# Patient Record
Sex: Male | Born: 1985 | Race: Black or African American | Hispanic: No | Marital: Single | Smoking: Current every day smoker
Health system: Southern US, Community
[De-identification: ages and names within clinical notes are randomized; demographics above are authoritative.]

## PROBLEM LIST (undated history)

## (undated) ENCOUNTER — Ambulatory Visit (HOSPITAL_COMMUNITY): Admission: EM | Payer: Self-pay

## (undated) DIAGNOSIS — K219 Gastro-esophageal reflux disease without esophagitis: Secondary | ICD-10-CM

## (undated) DIAGNOSIS — IMO0001 Reserved for inherently not codable concepts without codable children: Secondary | ICD-10-CM

---
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
## 2007-06-28 ENCOUNTER — Emergency Department (HOSPITAL_COMMUNITY): Admission: EM | Admit: 2007-06-28 | Discharge: 2007-06-28 | Payer: Self-pay | Admitting: Emergency Medicine

## 2011-08-24 ENCOUNTER — Emergency Department (HOSPITAL_COMMUNITY)
Admission: EM | Admit: 2011-08-24 | Discharge: 2011-08-24 | Disposition: A | Discharge status: Home / Self Care | Payer: Self-pay | Attending: Emergency Medicine | Admitting: Emergency Medicine

## 2011-08-24 ENCOUNTER — Encounter (HOSPITAL_COMMUNITY): Payer: Self-pay | Admitting: *Deleted

## 2011-08-24 DIAGNOSIS — S61409A Unspecified open wound of unspecified hand, initial encounter: Secondary | ICD-10-CM | POA: Insufficient documentation

## 2011-08-24 DIAGNOSIS — S61419A Laceration without foreign body of unspecified hand, initial encounter: Secondary | ICD-10-CM

## 2011-08-24 DIAGNOSIS — IMO0002 Reserved for concepts with insufficient information to code with codable children: Secondary | ICD-10-CM | POA: Insufficient documentation

## 2011-08-24 MED ORDER — LIDOCAINE-EPINEPHRINE-TETRACAINE (LET) SOLUTION
3.0000 mL | Freq: Once | NASAL | Status: AC
  Administered 2011-08-24: 3 mL via TOPICAL
  Filled 2011-08-24: qty 3

## 2011-08-24 MED ORDER — TETANUS-DIPHTH-ACELL PERTUSSIS 5-2.5-18.5 LF-MCG/0.5 IM SUSP
0.5000 mL | Freq: Once | INTRAMUSCULAR | Status: AC
  Administered 2011-08-24: 0.5 mL via INTRAMUSCULAR
  Filled 2011-08-24: qty 0.5

## 2011-08-24 MED ORDER — AMOXICILLIN-POT CLAVULANATE 875-125 MG PO TABS: 1.0000 | ORAL_TABLET | Freq: Two times a day (BID) | ORAL | Status: AC
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
## 2011-08-24 NOTE — ED Notes (Signed)
Wound care complete. Soaked and scrubbed with betadine and rinsed with saline. Waiting for Doctor to recheck.

## 2011-08-24 NOTE — Progress Notes (Signed)
Orthopedic Tech Progress Note Patient Details:  Alan Richardson 1985/09/28 [id number]  Type of Splint: Volar Splint Interventions: Application    Cammer, Mickie Bail 08/24/2011, 2:38 PM

## 2011-08-24 NOTE — Discharge Instructions (Signed)
Delayed Wound Closure Sometimes, your caregiver may decide to delay closing a wound for several days. This is done when the wound is badly bruised, dirty, or when it has been too long since the injury. By delaying the closure of your cut, the risk of infection is reduced. Wounds that are closed in 3 to 7 days after being cleaned up and dressed heal just as well as those that are closed right away. Rest and elevate the injured area until all the pain and swelling are gone.  SEEK MEDICAL CARE IF:  You develop unusual swelling or redness around the wound.   You have increasing pain or tenderness.   There is a bad smelling fluid (drainage) coming from the wound.  Have your wound checked as instructed by your caregiver. Document Released: 04/04/2005 Document Revised: 03/24/2011 Document Reviewed: 09/13/2006 [facility] Patient Information 2012 [place], [place].

## 2011-11-07 ENCOUNTER — Emergency Department (HOSPITAL_COMMUNITY)
Admission: EM | Admit: 2011-11-07 | Discharge: 2011-11-08 | Disposition: A | Discharge status: Home / Self Care | Payer: Self-pay | Attending: Emergency Medicine | Admitting: Emergency Medicine

## 2011-11-07 ENCOUNTER — Encounter (HOSPITAL_COMMUNITY): Payer: Self-pay | Admitting: Emergency Medicine

## 2011-11-07 DIAGNOSIS — B356 Tinea cruris: Secondary | ICD-10-CM

## 2011-11-07 DIAGNOSIS — K219 Gastro-esophageal reflux disease without esophagitis: Secondary | ICD-10-CM | POA: Insufficient documentation

## 2011-11-07 DIAGNOSIS — R21 Rash and other nonspecific skin eruption: Secondary | ICD-10-CM | POA: Insufficient documentation

## 2011-11-07 DIAGNOSIS — F17200 Nicotine dependence, unspecified, uncomplicated: Secondary | ICD-10-CM | POA: Insufficient documentation

## 2011-11-07 HISTORY — DX: Reserved for inherently not codable concepts without codable children: IMO0001

## 2011-11-07 HISTORY — DX: Gastro-esophageal reflux disease without esophagitis: K21.9

## 2011-11-07 NOTE — ED Notes (Signed)
Advised of the wait time 

## 2011-11-07 NOTE — ED Notes (Addendum)
When sleeps at night, has been having heartburn, wakes up with burping and feeling like throwing up; used to take zantac but has run out, last took it a couple weeks ago, and that's when noticed the heartburn getting worse, feels regurgitation of food, but doesn't actually throw up; also reports that went to beach last week and has a rash in between his legs with itching;

## 2011-11-08 MED ORDER — OMEPRAZOLE 20 MG PO CPDR: 20.0000 mg | DELAYED_RELEASE_CAPSULE | Freq: Every day | ORAL | Status: DC

## 2011-11-08 MED ORDER — MICONAZOLE NITRATE 2 % EX CREA: TOPICAL_CREAM | Freq: Two times a day (BID) | CUTANEOUS | Status: AC

## 2011-11-08 NOTE — ED Provider Notes (Signed)
History     CSN: [id number]  Arrival date & time 11/07/11  2157   First MD Initiated Contact with Patient 11/07/11 2338      Chief Complaint  Patient presents with  . Gastrophageal Reflux  . Rash    (Consider location/radiation/quality/duration/timing/severity/associated sxs/prior treatment) HPI Comments: Patient presents emergency department with multiple chief complaints.  Primarily he states that he's noticed a rash in his inguinal creases that began approximately one week ago.  He describes it is extremely pruritic in nature and denies any fevers, night sweats, chills.  In addition patient reports that he's been having worsened acid reflux and that he usually takes Zyrtec however he has not taken any in one week.  He denies any abdominal pain, melena, hematochezia, hematemesis, nausea, vomiting, diarrhea, or gross blood in stool.  No other complaints at this time.  Patient is a 26 y.o. male presenting with GERD and rash. The history is provided by the patient.  Gastrophageal Reflux Associated symptoms include a rash.  Rash     Past Medical History  Diagnosis Date  . Reflux     History reviewed. No pertinent past surgical history.  History reviewed. No pertinent family history.  History  Substance Use Topics  . Smoking status: Current Everyday Smoker -- 0.2 packs/day  . Smokeless tobacco: Not on file  . Alcohol Use: Yes     on weekends      Review of Systems  Skin: Positive for rash.  All other systems reviewed and are negative.    Allergies  Review of patient's allergies indicates no known allergies.  Home Medications  No current outpatient prescriptions on file.  BP 152/82  Pulse 112  Temp 98.3 F (36.8 C) (Oral)  Resp 16  SpO2 98%  Physical Exam  Nursing note and vitals reviewed. Constitutional: He is oriented to person, place, and time. He appears well-developed and well-nourished. No distress.  HENT:  Head: Normocephalic and atraumatic.    Moist mucous membranes, uvula midline  Eyes: Conjunctivae and EOM are normal.  Neck: Normal range of motion.  Pulmonary/Chest: Effort normal.  Abdominal:       Soft nontender abdomen with bowel sounds present  Musculoskeletal: Normal range of motion.  Neurological: He is alert and oriented to person, place, and time.  Skin: Skin is warm and dry. No rash noted. He is not diaphoretic.       Rash located in inguinal full bilaterally, hyperpigmentation, and scaly. Skin intact, no lesions.   Psychiatric: He has a normal mood and affect. His behavior is normal.    ED Course  Procedures (including critical care time)  Labs Reviewed - No data to display No results found.   No diagnosis found.    MDM  Groin rash: Presentation C/w Tinia Cruris - prescribed topical antifungal, home care instructions and return precautions discussed GERD: Prilosec started. Follow up w PCP         Jaci Carrel, PA-C 11/08/11 0019

## 2011-11-17 NOTE — ED Provider Notes (Signed)
Medical screening examination/treatment/procedure(s) were performed by non-physician practitioner and as supervising physician I was immediately available for consultation/collaboration.   Naudia Crosley, MD 11/17/11 2313 

## 2013-09-15 ENCOUNTER — Emergency Department (HOSPITAL_COMMUNITY)
Admission: EM | Admit: 2013-09-15 | Discharge: 2013-09-16 | Disposition: A | Discharge status: Home / Self Care | Payer: Self-pay | Attending: Emergency Medicine | Admitting: Emergency Medicine

## 2013-09-15 ENCOUNTER — Encounter (HOSPITAL_COMMUNITY): Payer: Self-pay | Admitting: Emergency Medicine

## 2013-09-15 DIAGNOSIS — Z8719 Personal history of other diseases of the digestive system: Secondary | ICD-10-CM | POA: Insufficient documentation

## 2013-09-15 DIAGNOSIS — Z791 Long term (current) use of non-steroidal anti-inflammatories (NSAID): Secondary | ICD-10-CM | POA: Insufficient documentation

## 2013-09-15 DIAGNOSIS — K089 Disorder of teeth and supporting structures, unspecified: Secondary | ICD-10-CM | POA: Insufficient documentation

## 2013-09-15 DIAGNOSIS — R42 Dizziness and giddiness: Secondary | ICD-10-CM | POA: Insufficient documentation

## 2013-09-15 DIAGNOSIS — Z792 Long term (current) use of antibiotics: Secondary | ICD-10-CM | POA: Insufficient documentation

## 2013-09-15 DIAGNOSIS — R112 Nausea with vomiting, unspecified: Secondary | ICD-10-CM | POA: Insufficient documentation

## 2013-09-15 DIAGNOSIS — K0889 Other specified disorders of teeth and supporting structures: Secondary | ICD-10-CM

## 2013-09-15 DIAGNOSIS — F17200 Nicotine dependence, unspecified, uncomplicated: Secondary | ICD-10-CM | POA: Insufficient documentation

## 2013-09-15 DIAGNOSIS — K029 Dental caries, unspecified: Secondary | ICD-10-CM | POA: Insufficient documentation

## 2013-09-15 LAB — CBG MONITORING, ED: Glucose-Capillary: 224 mg/dL — ABNORMAL HIGH (ref 70–99)

## 2013-09-15 NOTE — ED Notes (Signed)
Pt states he woke up today feeling dizzy, he has vomited a few times and complains of a slight headache

## 2013-09-15 NOTE — ED Notes (Signed)
Patients tooth pain not related to tonight's visit.

## 2013-09-16 LAB — CBC
HEMATOCRIT: 43.1 % (ref 39.0–52.0)
Hemoglobin: 15 g/dL (ref 13.0–17.0)
MCH: 29.4 pg (ref 26.0–34.0)
MCHC: 34.8 g/dL (ref 30.0–36.0)
MCV: 84.5 fL (ref 78.0–100.0)
PLATELETS: 240 10*3/uL (ref 150–400)
RBC: 5.1 MIL/uL (ref 4.22–5.81)
RDW: 12.9 % (ref 11.5–15.5)
WBC: 8.2 10*3/uL (ref 4.0–10.5)

## 2013-09-16 LAB — I-STAT TROPONIN, ED: Troponin i, poc: 0 ng/mL (ref 0.00–0.08)

## 2013-09-16 LAB — BASIC METABOLIC PANEL
BUN: 10 mg/dL (ref 6–23)
CO2: 24 mEq/L (ref 19–32)
CREATININE: 0.78 mg/dL (ref 0.50–1.35)
Calcium: 9.5 mg/dL (ref 8.4–10.5)
Chloride: 101 mEq/L (ref 96–112)
Glucose, Bld: 197 mg/dL — ABNORMAL HIGH (ref 70–99)
POTASSIUM: 4.2 meq/L (ref 3.7–5.3)
Sodium: 137 mEq/L (ref 137–147)

## 2013-09-16 MED ORDER — DICLOFENAC SODIUM 50 MG PO TBEC: 50.0000 mg | DELAYED_RELEASE_TABLET | Freq: Two times a day (BID) | ORAL | Status: DC

## 2013-09-16 MED ORDER — AMOXICILLIN 500 MG PO CAPS: 500.0000 mg | ORAL_CAPSULE | Freq: Three times a day (TID) | ORAL | Status: DC

## 2013-09-16 MED ORDER — ONDANSETRON HCL 4 MG PO TABS: 4.0000 mg | ORAL_TABLET | Freq: Four times a day (QID) | ORAL | Status: DC

## 2013-09-16 MED ORDER — ONDANSETRON HCL 4 MG/2ML IJ SOLN
4.0000 mg | Freq: Once | INTRAMUSCULAR | Status: AC
  Administered 2013-09-16: 4 mg via INTRAVENOUS
  Filled 2013-09-16: qty 2

## 2013-09-16 MED ORDER — MECLIZINE HCL 12.5 MG PO TABS: 12.5000 mg | ORAL_TABLET | Freq: Three times a day (TID) | ORAL | Status: DC | PRN

## 2013-09-16 MED ORDER — MECLIZINE HCL 25 MG PO TABS
25.0000 mg | ORAL_TABLET | Freq: Once | ORAL | Status: AC
  Administered 2013-09-16: 25 mg via ORAL
  Filled 2013-09-16: qty 1

## 2013-09-16 MED ORDER — SODIUM CHLORIDE 0.9 % IV BOLUS (SEPSIS)
500.0000 mL | Freq: Once | INTRAVENOUS | Status: AC
  Administered 2013-09-16: 500 mL via INTRAVENOUS

## 2013-09-16 NOTE — Discharge Instructions (Signed)
Take amoxicillin to cover for infection. Take diclofenac as prescribed for pain control as needed. Take meclizine for dizziness and Zofran for nausea as needed. Followup with your primary care provider in dentist.  Benign Positional Vertigo Vertigo means you feel like you or your surroundings are moving when they are not. Benign positional vertigo is the most common form of vertigo. Benign means that the cause of your condition is not serious. Benign positional vertigo is more common in older adults. CAUSES  Benign positional vertigo is the result of an upset in the labyrinth system. This is an area in the middle ear that helps control your balance. This may be caused by a viral infection, head injury, or repetitive motion. However, often no specific cause is found. SYMPTOMS  Symptoms of benign positional vertigo occur when you move your head or eyes in different directions. Some of the symptoms may include:  Loss of balance and falls.  Vomiting.  Blurred vision.  Dizziness.  Nausea.  Involuntary eye movements (nystagmus). DIAGNOSIS  Benign positional vertigo is usually diagnosed by physical exam. If the specific cause of your benign positional vertigo is unknown, your caregiver may perform imaging tests, such as magnetic resonance imaging (MRI) or computed tomography (CT). TREATMENT  Your caregiver may recommend movements or procedures to correct the benign positional vertigo. Medicines such as meclizine, benzodiazepines, and medicines for nausea may be used to treat your symptoms. In rare cases, if your symptoms are caused by certain conditions that affect the inner ear, you may need surgery. HOME CARE INSTRUCTIONS   Follow your caregiver's instructions.  Move slowly. Do not make sudden body or head movements.  Avoid driving.  Avoid operating heavy machinery.  Avoid performing any tasks that would be dangerous to you or others during a vertigo episode.  Drink enough fluids to  keep your urine clear or pale yellow. SEEK IMMEDIATE MEDICAL CARE IF:   You develop problems with walking, weakness, numbness, or using your arms, hands, or legs.  You have difficulty speaking.  You develop severe headaches.  Your nausea or vomiting continues or gets worse.  You develop visual changes.  Your family or friends notice any behavioral changes.  Your condition gets worse.  You have a fever.  You develop a stiff neck or sensitivity to light. MAKE SURE YOU:   Understand these instructions.  Will watch your condition.  Will get help right away if you are not doing well or get worse. Document Released: 01/10/2006 Document Revised: 06/27/2011 Document Reviewed: 12/23/2010 [facility] Patient Information 2014 [place], [place].  Dental Pain Toothache is pain in or around a tooth. It may get worse with chewing or with cold or heat.  HOME CARE  Your dentist may use a numbing medicine during treatment. If so, you may need to avoid eating until the medicine wears off. Ask your dentist about this.  Only take medicine as told by your dentist or doctor.  Avoid chewing food near the painful tooth until after all treatment is done. Ask your dentist about this. GET HELP RIGHT AWAY IF:   The problem gets worse or new problems appear.  You have a fever.  There is redness and puffiness (swelling) of the face, jaw, or neck.  You cannot open your mouth.  There is pain in the jaw.  There is very bad pain that is not helped by medicine. MAKE SURE YOU:   Understand these instructions.  Will watch your condition.  Will get help right away if you  are not doing well or get worse. Document Released: 09/21/2007 Document Revised: 06/27/2011 Document Reviewed: 09/21/2007 [facility] Patient Information 2014 [place].  Emergency Department Resource Guide 1) Find a Doctor and Pay Out of Pocket Although you won't have to find out who is covered by your insurance plan, it is a  good idea to ask around and get recommendations. You will then need to call the office and see if the doctor you have chosen will accept you as a new patient and what types of options they offer for patients who are self-pay. Some doctors offer discounts or will set up payment plans for their patients who do not have insurance, but you will need to ask so you aren't surprised when you get to your appointment.  2) Contact Your Local Health Department Not all health departments have doctors that can see patients for sick visits, but many do, so it is worth a call to see if yours does. If you don't know where your local health department is, you can check in your phone book. The CDC also has a tool to help you locate your state's health department, and many state websites also have listings of all of their local health departments.  3) Find a Walk-in Clinic If your illness is not likely to be very severe or complicated, you may want to try a walk in clinic. These are popping up all over the country in pharmacies, drugstores, and shopping centers. They're usually staffed by nurse practitioners or physician assistants that have been trained to treat common illnesses and complaints. They're usually fairly quick and inexpensive. However, if you have serious medical issues or chronic medical problems, these are probably not your best option.  No Primary Care Doctor: - Call Health Connect at  [phone] - they can help you locate a primary care doctor that  accepts your insurance, provides certain services, etc. - Physician Referral Service- [phone]  Chronic Pain Problems: Organization         Address  Phone   Notes  [facility]  [phone] Patients need to be referred by their primary care doctor.   Medication Assistance: Organization         Address  Phone   Notes  [facility] [address] [place], [place] [address] [phone])  [phone] --Must be a resident of [place] [facility] -- Must have NO insurance coverage whatsoever (no Medicaid/ Medicare, etc.) -- The pt. MUST have a primary care doctor that directs their care regularly and follows them in the community   Med[facility]ist  [phone]   [employer]  [phone]    Agencies that provide inexpensive medical care: Organization         Address  Phone   Notes  [facility]  [phone]) [phone]   [facility]    [phone]) [phone]   [facility] [address] [place], [place] [address] [phone]) [phone]   [facility] [address], [place] [phone]   [facility]anned Parenthood    [phone](336) [phone]   [facility]    [phone]   [facility]  [address], [place] Phone:  [phone]) [phone], Fax:  [phone] Hours of Operation:  9 am - 6 pm, M-F.  Also accepts Medicaid/Medicare and self-pay.  [facility]  [address],  Phone: [phone],  Fax: [phone]. Hours of Operation:  8:30 am - 5:30 pm, M-F.  Also accepts Medicaid and self-pay.  [facility] [place] [address] [place] Phone: [phone]   [facility] [address] Natasha Bence [place], [place] [phone], Ext. [phone] Mondays & Thursdays: 7-9 AM.  First 15 patients are seen on a first come, first serve basis.    Medicaid-accepting [facility] Providers:  Organization         Address  Phone   Notes  [facility] [address], [place] [phone] Also accepts self-pay patients.  [facility] [address] Laurell Josephs [place], [place]  [phone]   [facility] [address], [place] [phone]   [facility] [address] [place] [phone]   Renaye Rakers [address], [place]   [phone] Only accepts [place] Access [place] patients after they have their name applied to their card.   Self-Pay (no insurance) in [facility]:  Organization         Address  Phone   Notes  Sickle Cell Patients, [facility] [address] [place], [place] [phone]   [facility] [address] [place], [place] [phone]   [facility] [place]  [address], [place] [phone]   [facility]/Dr. Osei-Bonsu  [address] [place] or [address], [place] [phone] Phone number for both [place] and [place] locations is the same.  [facility] [address], [place] [phone]   [facility] [address] [place] or [address] [phone]   [facility] [address], [place] [phone], phone; [phone], fax Sees patients 1st and 3rd Saturday of every month.  Must not qualify for public or private insurance (i.e. Medicaid, Medicare, [place] Health Choice, Veterans' Benefits)  Household income should be no more than 200% of the poverty level The clinic cannot treat you if you are pregnant or think you are pregnant  Sexually transmitted diseases are not treated at the clinic.    Dental Care: Organization         Address  Phone  Notes  [facility] of [facility] [address] [place], [place] [phone] Accepts children up to age 43 who are enrolled in [place] or [place] Health Choice; pregnant women with a Medicaid card; and children who have applied for Medicaid or [place] Health Choice, but were declined, whose parents can pay a reduced fee at time of service.  [facility]  [address], [place] [phone] Accepts children up to age 77 who are enrolled in [place] or [place] Health  Choice; pregnant women with a Medicaid card; and children who have applied for Medicaid or [place] Health Choice, but were declined, whose parents can pay a reduced fee at time of service.  [facility]  [address] [place], [place] [phone] Patients are seen by appointment only. Walk-ins are not accepted. [facility] will see patients 19 years of age and older. Monday - Tuesday (8am-5pm) Most Wednesdays (8:30-5pm) $30 per visit, cash only  [facility] [employer] [facility]  [address], [employer] (  [phone] Patients are seen by appointment only. Walk-ins are not accepted. [facility] will see patients 67 years of age and older. One Wednesday Evening (Monthly: Volunteer Based).  $30 per visit, cash only  [employer]  [phone] for adults; Children under age 39, call Graduate Pediatric Dentistry at [phone]. Children aged 74-14, please call [phone] to request a pediatric application.  Dental services are provided in all areas of dental care including fillings, crowns and bridges, complete and partial dentures, implants, gum treatment, root canals, and extractions. Preventive care is also provided. Treatment is provided to both adults and children. Patients are selected via a lottery and there is often a waiting list.   [facility] [address], [place]  [phone] [email or web address]   [facility] [address] [place], [place] [phone], Ext. [phone] Second and Fourth Thursday of each month, opens at 6:30 AM; Clinic ends at 9 AM.  Patients are seen on a first-come first-served basis, and a limited number are seen during each clinic.   [facility]  [address] Ether [place], [place] [phone]   Eligibility Requirements You must have lived in [place], [place], or [place] counties for at least the last three months.   You cannot be eligible for state or  federal sponsored [place], including [employer], [place], or [employer].   You generally cannot be eligible for healthcare insurance through your employer.    How to apply: Eligibility screenings are held every Tuesday and Wednesday afternoon from 1:00 pm until 4:00 pm. You do not need an appointment for the interview!  [facility] [address] [place], [place] [phone]   [facility] Health Department  [phone]   [facility] Health Department  [phone]   [facility] Health Department  [phone]    Behavioral Health Resources in the Community: Intensive Outpatient Programs Organization         Address  Phone  Notes  [facility] Services [address] [place], [place] [phone]   [facility] Outpatient [address], [place], [place] [phone]   [facility] [address] [place], [place]  [phone]   [facility] Mental Health [address],  [place], [place] [phone] or [phone]   Substance Abuse Resources Organization         Address  Phone  Notes  [facility] Drug [facility]  8438038124   [facility]  [phone]   [facility]  [phone]   Floydene Flock  [phone]   Residential & Outpatient Substance Abuse Program  [phone]   Psychological Services Organization         Address  Phone  Notes  [facility]  [phone]   [facility] Services  [phone]   [facility] Mental Health [address], [place] [phone] or [phone]    Mobile Crisis Teams Organization         Address  Phone  Notes  [facility], Mobile Crisis Care Unit  [phone]   [facility]  [address] [place], [place] [phone]   Doristine Locks [address] [place] [phone]    Self-Help/Support Groups Organization         Address  Phone              Notes  [facility] [place] - variety of support groups  336- I7437963 Call for more information  Narcotics Anonymous (NA), [facility] [address], [employer] [place]  2 meetings at this location   Residential Sports administrator         Address  Phone  Notes  [facility] [address],    [place]  [phone]   [facility]  [address] [place] [address], [place], [place] [phone]   [facility] [address] [place], [place] [phone] Admissions: 8am-3pm M-F  [facility] [address]    [place], [place] [phone]   [facility] [address] [place], [place], [place] [phone]   [facility] [address],  [place], [place] [phone]   [facility] - Intensive Outpatient [address] Laurell Josephs [address], [place], [place] [phone]   [facility] (Addiction Recovery [facility] [address] [place].,  [place], [place] [phone] or [phone]   [facility] ([facility]) [address] [place], [place] [phone] Accepts Medicaid  [facility] [address],  [place] [phone] Substance Abuse/Addiction Treatment   [facility] Organization         Address  Phone  Notes  [facility]  4248242380   Angie Fava, PhD [address] Ervin Knack [place], [place]   [phone] or [phone]   [facility]   [address] [place], [place] [phone]   [facility] [address], [place], [place] [phone] Insurance/Medicaid/sponsorship through [facility] and Families [address]                                    [employer] [employer], [place] [phone] Therapy/tele-psych/case  [facility] [address], [place] [phone]    Dr. Lolly Mustache  [phone]   [facility] [place]  [employer] [facility]  Dept. 1) [address] [place] 2) [address] [place] 3)  [address], [place] [phone]  [phone]   [facility] Child Abuse Hotline 506-165-6672 or [phone] (After Hours)

## 2013-09-16 NOTE — ED Notes (Signed)
Patient states his mother has diabetes, but he has not been checked by his primary that he is aware of.

## 2013-09-16 NOTE — ED Provider Notes (Signed)
CSN: [id number]     Arrival date & time 09/15/13  2245 History   First MD Initiated Contact with Patient 09/16/13 0030     Chief Complaint  Patient presents with  . Dizziness    (Consider location/radiation/quality/duration/timing/severity/associated sxs/prior Treatment) HPI Comments: 28 year old male presents to the emergency department for dizziness. Patient states that he awoke from sleep feeling as though the room was spinning. He cannot recall the direction in which he felt the room was spinning. Patient states that symptoms have been intermittent throughout the day and are worse with position change, most specifically from supine to sitting or sitting to standing. Symptoms associated with nausea as well as 2 episodes of nonbloody emesis. Patient states the symptoms have been preceded by right upper dentalgia which causes an intermittent headache. Patient denies any headache today since dizziness started. He further denies associated fever, vision changes, vision loss, tinnitus or hearing loss, difficulty speaking or swallowing, neck stiffness, shortness of breath, chest pain, abdominal pain, numbness/tingling, and extremity weakness. He denies any head injury or trauma. He states that he ingested Mollies 2 days ago. No hx of vertigo.  The history is provided by the patient. No language interpreter was used.    Past Medical History  Diagnosis Date  . Reflux    History reviewed. No pertinent past surgical history. History reviewed. No pertinent family history. History  Substance Use Topics  . Smoking status: Current Every Day Smoker -- 0.25 packs/day  . Smokeless tobacco: Not on file  . Alcohol Use: Yes     Comment: on weekends    Review of Systems  Constitutional: Negative for fever.  HENT: Positive for dental problem. Negative for drooling, facial swelling, hearing loss, sore throat, tinnitus and trouble swallowing.   Eyes: Negative for visual disturbance.  Respiratory: Negative  for shortness of breath.   Cardiovascular: Negative for chest pain.  Gastrointestinal: Positive for nausea and vomiting. Negative for abdominal pain.  Neurological: Positive for dizziness. Negative for syncope, weakness, numbness and headaches.  All other systems reviewed and are negative.    Allergies  Review of patient's allergies indicates no known allergies.  Home Medications   Prior to Admission medications   Medication Sig Start Date End Date Taking? Authorizing Provider  acetaminophen (TYLENOL) 500 MG tablet Take 1,500-2,000 mg by mouth every 6 (six) hours as needed for moderate pain.   Yes Historical Provider, MD  Oxycodone HCl 10 MG TABS Take 10 mg by mouth once.   Yes Historical Provider, MD  amoxicillin (AMOXIL) 500 MG capsule Take 1 capsule (500 mg total) by mouth 3 (three) times daily. 09/16/13   Antony Madura, PA-C  diclofenac (VOLTAREN) 50 MG EC tablet Take 1 tablet (50 mg total) by mouth 2 (two) times daily. 09/16/13   Antony Madura, PA-C  meclizine (ANTIVERT) 12.5 MG tablet Take 1 tablet (12.5 mg total) by mouth 3 (three) times daily as needed for dizziness. 09/16/13   Antony Madura, PA-C  ondansetron (ZOFRAN) 4 MG tablet Take 1 tablet (4 mg total) by mouth every 6 (six) hours. 09/16/13   Antony Madura, PA-C   BP 135/82  Pulse 86  Temp(Src) 97.7 F (36.5 C) (Oral)  Resp 20  Ht 5\' 9"  (1.753 m)  Wt 200 lb (90.719 kg)  BMI 29.52 kg/m2  SpO2 95%  Physical Exam  Nursing note and vitals reviewed. Constitutional: He is oriented to person, place, and time. He appears well-developed and well-nourished. No distress.  Nontoxic/nonseptic appearing  HENT:  Head: Normocephalic  and atraumatic.  Right Ear: Hearing, tympanic membrane, external ear and ear canal normal. No mastoid tenderness.  Left Ear: Hearing, tympanic membrane, external ear and ear canal normal. No mastoid tenderness.  Nose: Nose normal.  Mouth/Throat: Uvula is midline, oropharynx is clear and moist and mucous membranes  are normal. No oral lesions. No trismus in the jaw. Abnormal dentition. Dental caries present. No dental abscesses or uvula swelling. No oropharyngeal exudate.    Uvula midline. No fluctuance. Patient tolerating secretions without difficulty.  Eyes: Conjunctivae and EOM are normal. Pupils are equal, round, and reactive to light. No scleral icterus.  No nystagmus. Normal EOMs. Pupils equal round and reactive to direct and consensual light.  Neck: Normal range of motion. Neck supple.  Cardiovascular: Normal rate, regular rhythm and normal heart sounds.   Pulmonary/Chest: Effort normal and breath sounds normal. No respiratory distress. He has no wheezes. He has no rales.  Chest expansion symmetric.  Abdominal: Soft. He exhibits no distension. There is no tenderness.  Soft and nontender  Musculoskeletal: Normal range of motion.  Neurological: He is alert and oriented to person, place, and time. No cranial nerve deficit. He exhibits normal muscle tone. Coordination normal.  GCS 15. No cranial nerve deficits appreciated; symmetric eyebrow areas, no facial drooping. Patient moves extremities without ataxia. Normal and equal grip strength bilaterally. Normal sensation to light touch in all extremities. Patient ambulates with normal gait.  Skin: Skin is warm and dry. No rash noted. He is not diaphoretic. No erythema. No pallor.  Psychiatric: He has a normal mood and affect. His behavior is normal.    ED Course  Procedures (including critical care time) Labs Review Labs Reviewed  BASIC METABOLIC PANEL - Abnormal; Notable for the following:    Glucose, Bld 197 (*)    All other components within normal limits  CBG MONITORING, ED - Abnormal; Notable for the following:    Glucose-Capillary 224 (*)    All other components within normal limits  CBC  I-STAT TROPOININ, ED    Imaging Review No results found.   EKG Interpretation   Date/Time:  Sunday Sep 15 2013 23:50:34 EDT Ventricular Rate:   94 PR Interval:  127 QRS Duration: 87 QT Interval:  356 QTC Calculation: 445 R Axis:   81 Text Interpretation:  Sinus rhythm Borderline T wave abnormalities ST  elevation in the apical and inf  leads < 66mm Confirmed by Rhunette Croft, MD,  Janey Genta ([id number]) on 09/16/2013 12:09:48 AM      MDM   Final diagnoses:  Vertigo  Dentalgia    Patient presents today for dizziness. Symptoms c/w BPPV. No nystagmus. Neurologic exam nonfocal. No head injury or trauma. No fever. Patient does endorse ingestion of Mollies 2 days ago; question whether symptoms associated with ingestion of this drug. Symptom improved over ED course with IVF hydration, meclizine, and Zofran. Patient states symptoms preceded by toothache. No gross abscess. Exam unconcerning for Ludwig's angina or spread of infection. Will treat with amoxicillin and pain medicine. Urged patient to follow-up with dentist. He is stable for d/c today with necessary return precautions as well as script for Zofran and Meclizine as needed. Patient agreeable to plan with no unaddressed concerns.   Filed Vitals:   09/16/13 0059 09/16/13 0101 09/16/13 0103 09/16/13 0255  BP: 146/82 146/89 151/88 135/82  Pulse: 84 95 105 86  Temp:    97.7 F (36.5 C)  TempSrc:    Oral  Resp:    20  Height:  Weight:      SpO2:    95%       Antony MaduraKelly Dangelo Guzzetta, PA-C 09/16/13 [phone]

## 2013-09-18 NOTE — ED Provider Notes (Signed)
Medical screening examination/treatment/procedure(s) were conducted as a shared visit with non-physician practitioner(s) and myself.  I personally evaluated the patient during the encounter.   EKG Interpretation   Date/Time:  Sunday Sep 15 2013 23:50:34 EDT Ventricular Rate:  94 PR Interval:  127 QRS Duration: 87 QT Interval:  356 QTC Calculation: 445 R Axis:   81 Text Interpretation:  Sinus rhythm Borderline T wave abnormalities ST  elevation in the apical and inf  leads < 65mm Confirmed by Talton Delpriore, MD,  Zadrian Mccauley ([id number]) on 09/16/2013 12:09:48 AM      Pt comes in with cc of vertigo. Sudden onset, intermittent, with nausea. Symptoms provoked with movement. Admits to [employer]" use 1 night ago. No concerns for stroke or central nature to the vertigo - as there is no vertical nystagmus, no risk factors, and frankly the sx are more consistent with peripheral vertigo. Will d.c with meds for vertigo.  Derwood Kaplan, MD 09/18/13 [phone]

## 2013-12-01 ENCOUNTER — Encounter (HOSPITAL_COMMUNITY): Payer: Self-pay | Admitting: Emergency Medicine

## 2013-12-01 ENCOUNTER — Emergency Department (HOSPITAL_COMMUNITY)
Admission: EM | Admit: 2013-12-01 | Discharge: 2013-12-01 | Disposition: A | Discharge status: Home / Self Care | Payer: Self-pay | Attending: Emergency Medicine | Admitting: Emergency Medicine

## 2013-12-01 DIAGNOSIS — K0889 Other specified disorders of teeth and supporting structures: Secondary | ICD-10-CM

## 2013-12-01 DIAGNOSIS — Z791 Long term (current) use of non-steroidal anti-inflammatories (NSAID): Secondary | ICD-10-CM | POA: Insufficient documentation

## 2013-12-01 DIAGNOSIS — F17200 Nicotine dependence, unspecified, uncomplicated: Secondary | ICD-10-CM | POA: Insufficient documentation

## 2013-12-01 DIAGNOSIS — K089 Disorder of teeth and supporting structures, unspecified: Secondary | ICD-10-CM | POA: Insufficient documentation

## 2013-12-01 MED ORDER — PENICILLIN V POTASSIUM 500 MG PO TABS: 500.0000 mg | ORAL_TABLET | Freq: Four times a day (QID) | ORAL | Status: AC

## 2013-12-01 MED ORDER — TRAMADOL HCL 50 MG PO TABS: 50.0000 mg | ORAL_TABLET | Freq: Four times a day (QID) | ORAL | Status: DC | PRN

## 2013-12-01 NOTE — Discharge Instructions (Signed)
You have a dental injury. Use the resource guide listed below to help you find a dentist if you do not already have one to followup with. It is very important that you get evaluated by a dentist as soon as possible. Call tomorrow to schedule an appointment. Use your pain medication as prescribed and do not operate heavy machinery while on pain medication. Take your full course of antibiotics. Read the instructions below. ° °Eat a soft or liquid diet and rinse your mouth out after meals with warm water. You should see a dentist or return here at once if you have increased swelling, increased pain or uncontrolled bleeding from the site of your injury. ° ° °SEEK MEDICAL CARE IF:  °· You have increased pain not controlled with medicines.  °· You have swelling around your tooth, in your face or neck.  °· You have bleeding which starts, continues, or gets worse.  °· You have a fever >101 °· If you are unable to open your mouth ° °RESOURCE GUIDE ° °Dental Problems ° °Patients with Medicaid: °[facility]                     [facility] °[address]                                           [address] °Phone:  [phone]                                                  Phone:  [phone] ° °If unable to pay or uninsured, contact:  [facility] or [facility] to become qualified for the adult dental clinic. ° °Chronic Pain Problems °Contact [facility]  [phone] °Patients need to be referred by their primary care doctor. ° °Insufficient Money for Medicine °Contact [employer]:  call "211" or [facility] 271-5999. ° °No Primary Care Doctor °Call [facility]  832-8000 °Other agencies that provide inexpensive medical care °   [facility]  [phone] °   [facility]  832-7272 °   [facility]  [phone] °   [facility]  [phone] °   [facility]  [phone] °   [facility]   [phone] ° °Psychological Services °[facility]  [phone] °[facility]  [phone] °[facility]   [phone] (emergency services 641-4993) ° °Substance Abuse Resources °[facility]  336-882-2125 °[facility] 336-784-9470 °[facility] 336-285-9073 °[facility] 336-845-3988 °Residential & Outpatient Substance Abuse Program  800-659-3381 ° °Abuse/Neglect °[place] [facility] (336) 641-3795 °[place] [facility] Hotline 800-378-5315 (After Hours) ° °Emergency Shelter °[facility] (336) 271-5985 ° °Maternity Homes °[facility] (336) 275-9566 °[facility] (704) 372-4663 ° °MRSA Hotline #:   832-7006 ° ° ° °[place] [facility] ° °[facility] [facility]     [employer]                          [facility] °[address] [place]                         [address]      [address]  °[place]                                                [place]                            [place] °Phone:  349-3220                                   Phone:  [phone]                 Phone:  342-8140 ° °[facility] °Phone:  342-8316 ° °[place] Child [facility] Hotline °(336) 342-1394 °(336) 342-3537 (After Hours) ° ° ° ° °

## 2013-12-01 NOTE — ED Notes (Signed)
Toothache for 2-3 days 

## 2013-12-01 NOTE — ED Provider Notes (Signed)
CSN: [id number]     Arrival date & time 12/01/13  1650 History  This chart was scribed for non-physician practitioner working with Mirian MoMatthew Gentry, MD, by Roxy Cedarhandni Bhalodia ED Scribe. This patient was seen in room TR11C/TR11C and the patient's care was started at 6:14 PM  Chief Complaint  Patient presents with  . Dental Pain   The history is provided by the patient. No language interpreter was used.    HPI Comments: Alan Richardson is a 28 y.o. male who presents to the Emergency Department complaining of intermittent right sided upper dental pain that began 6 months ago and worsened in the past 2 weeks.  Patient states [facility], but has not been seen by his dentist since onset of symptoms. Patient states he has tried taking Tylenol and Ibuprofen with no pain relief.  Patient states he also took Vicodin provided by a family member with minimal relief. Patient denies any associated nausea, vomiting or difficulty swallowing.  Patient denies any associated tooth injury.  Past Medical History  Diagnosis Date  . Reflux    History reviewed. No pertinent past surgical history. History reviewed. No pertinent family history. History  Substance Use Topics  . Smoking status: Current Every Day Smoker -- 0.25 packs/day  . Smokeless tobacco: Not on file  . Alcohol Use: Yes     Comment: on weekends    Review of Systems  Constitutional: Negative for fever and chills.  HENT: Positive for dental problem (upper right sided tooth pain). Negative for facial swelling and trouble swallowing.   Eyes: Negative for discharge.  All other systems reviewed and are negative.   Allergies  Review of patient's allergies indicates no known allergies.  Home Medications   Prior to Admission medications   Medication Sig Start Date End Date Taking? Authorizing Provider  acetaminophen (TYLENOL) 500 MG tablet Take 1,500-2,000 mg by mouth every 6 (six) hours as needed for moderate pain.     Historical Provider, MD  amoxicillin (AMOXIL) 500 MG capsule Take 1 capsule (500 mg total) by mouth 3 (three) times daily. 09/16/13   Antony MaduraKelly Humes, PA-C  diclofenac (VOLTAREN) 50 MG EC tablet Take 1 tablet (50 mg total) by mouth 2 (two) times daily. 09/16/13   Antony MaduraKelly Humes, PA-C  meclizine (ANTIVERT) 12.5 MG tablet Take 1 tablet (12.5 mg total) by mouth 3 (three) times daily as needed for dizziness. 09/16/13   Antony MaduraKelly Humes, PA-C  ondansetron (ZOFRAN) 4 MG tablet Take 1 tablet (4 mg total) by mouth every 6 (six) hours. 09/16/13   Antony MaduraKelly Humes, PA-C  Oxycodone HCl 10 MG TABS Take 10 mg by mouth once.    Historical Provider, MD   Triage Vitals: BP 147/96  Pulse 99  Temp(Src) 97.7 F (36.5 C) (Oral)  Resp 18  SpO2 97% Physical Exam  Nursing note and vitals reviewed. Constitutional: He is oriented to person, place, and time. He appears well-developed and well-nourished. No distress.  HENT:  Head: Normocephalic and atraumatic.  Nose: Nose normal.  Mouth/Throat: Oropharynx is clear and moist.  Tenderness to palpation of right upper gingiva. No obvious abscess. No trismus, no sublingual tenderness or swelling. No submental or submandibular lymphadenopathy.  Eyes: Conjunctivae and EOM are normal.  Neck: Normal range of motion. Neck supple. No tracheal deviation present.  Cardiovascular: Normal rate, regular rhythm and normal heart sounds.   Pulmonary/Chest: Effort normal and breath sounds normal. No respiratory distress.  Musculoskeletal: Normal range of motion.  Neurological: He is  alert and oriented to person, place, and time.  Skin: Skin is warm and dry.  Psychiatric: He has a normal mood and affect. His behavior is normal.    ED Course  Procedures (including critical care time)  DIAGNOSTIC STUDIES: Oxygen Saturation is 97% on RA, normal by my interpretation.    COORDINATION OF CARE: 6:19 PM- Recommended patient to go see Dentist. Pt advised of plan for treatment and pt agrees.  Labs  Review Labs Reviewed - No data to display  Imaging Review No results found.   EKG Interpretation None      MDM   Final diagnoses:  None   Patient with toothache.  No gross abscess.  Exam unconcerning for Ludwig's angina or spread of infection.  Will treat with penicillin and pain medicine.  Urged patient to follow-up with dentist.  Patient stable for discharge.  I personally performed the services described in this documentation, which was scribed in my presence. The recorded information has been reviewed and is accurate.    Santiago Glad, PA-C 12/01/13 1836

## 2013-12-01 NOTE — ED Notes (Addendum)
Pt reports right side upper and lower dental pain, more severe since last night. Airway intact.pt took 2 percocet pta.

## 2013-12-04 NOTE — ED Provider Notes (Signed)
Medical screening examination/treatment/procedure(s) were performed by non-physician practitioner and as supervising physician I was immediately available for consultation/collaboration.   EKG Interpretation None        Mirian MoMatthew Kathalina Ostermann, MD 12/04/13 1032

## 2014-05-30 ENCOUNTER — Emergency Department (INDEPENDENT_AMBULATORY_CARE_PROVIDER_SITE_OTHER)
Admission: EM | Admit: 2014-05-30 | Discharge: 2014-05-30 | Disposition: A | Discharge status: Home / Self Care | Payer: Self-pay | Source: Home / Self Care | Attending: Family Medicine | Admitting: Family Medicine

## 2014-05-30 ENCOUNTER — Encounter (HOSPITAL_COMMUNITY): Payer: Self-pay | Admitting: Emergency Medicine

## 2014-05-30 DIAGNOSIS — A09 Infectious gastroenteritis and colitis, unspecified: Secondary | ICD-10-CM

## 2014-05-30 DIAGNOSIS — K529 Noninfective gastroenteritis and colitis, unspecified: Secondary | ICD-10-CM

## 2014-05-30 MED ORDER — ONDANSETRON HCL 4 MG PO TABS: 4.0000 mg | ORAL_TABLET | Freq: Four times a day (QID) | ORAL | Status: DC

## 2014-05-30 NOTE — ED Provider Notes (Signed)
CSN: [id number]     Arrival date & time 05/30/14  1513 History   First MD Initiated Contact with Patient 05/30/14 1557     Chief Complaint  Patient presents with  . Diarrhea  . Emesis  . Nausea  . Chills  . Weakness   (Consider location/radiation/quality/duration/timing/severity/associated sxs/prior Treatment) HPI Comments: 29 year old male complaining of lightheadedness, nausea, vomiting and diarrhea for approximately one and half days. He is here with his male significant other and small child, all of which have similar symptoms. The patient had 2 episodes of vomiting yesterday and none today. His most concerning symptom is diarrhea too numerous to count. Diarrhea is watery and without blood. This had some chills but no fever. Denies abdominal pain but feels rumbling and bubbling in the abdomen.   Past Medical History  Diagnosis Date  . Reflux    History reviewed. No pertinent past surgical history. History reviewed. No pertinent family history. History  Substance Use Topics  . Smoking status: Current Every Day Smoker -- 0.25 packs/day  . Smokeless tobacco: Not on file  . Alcohol Use: Yes     Comment: on weekends    Review of Systems  Constitutional: Positive for chills and activity change. Negative for fever.  HENT: Negative.   Respiratory: Negative.   Cardiovascular: Negative.   Gastrointestinal: Positive for nausea, vomiting and diarrhea. Negative for blood in stool.  Genitourinary: Negative.   Musculoskeletal: Negative.   Neurological: Positive for light-headedness.    Allergies  Review of patient's allergies indicates no known allergies.  Home Medications   Prior to Admission medications   Medication Sig Start Date End Date Taking? Authorizing Provider  Ranitidine HCl (ZANTAC 75 PO) Take by mouth.   Yes Historical Provider, MD  acetaminophen (TYLENOL) 500 MG tablet Take 1,500-2,000 mg by mouth every 6 (six) hours as needed for moderate pain.    Historical  Provider, MD  diclofenac (VOLTAREN) 50 MG EC tablet Take 1 tablet (50 mg total) by mouth 2 (two) times daily. 09/16/13   Antony Madura, PA-C  meclizine (ANTIVERT) 12.5 MG tablet Take 1 tablet (12.5 mg total) by mouth 3 (three) times daily as needed for dizziness. 09/16/13   Antony Madura, PA-C  ondansetron (ZOFRAN) 4 MG tablet Take 1 tablet (4 mg total) by mouth every 6 (six) hours. 05/30/14   Hayden Rasmussen, NP  Oxycodone HCl 10 MG TABS Take 10 mg by mouth once.    Historical Provider, MD  traMADol (ULTRAM) 50 MG tablet Take 1 tablet (50 mg total) by mouth every 6 (six) hours as needed. 12/01/13   Heather Laisure, PA-C   BP 128/79 mmHg  Pulse 103  Temp(Src) 98.8 F (37.1 C) (Oral)  Resp 18  SpO2 96% Physical Exam  Constitutional: He is oriented to person, place, and time. He appears well-developed and well-nourished. No distress.  HENT:  Mouth/Throat: Oropharynx is clear and moist.  Neck: Normal range of motion. Neck supple.  Cardiovascular: Normal rate, regular rhythm and normal heart sounds.   Pulmonary/Chest: Effort normal and breath sounds normal. No respiratory distress.  Abdominal: Soft. Bowel sounds are normal. He exhibits no distension and no mass. There is no tenderness. There is no rebound and no guarding.  Musculoskeletal: He exhibits no edema.  Lymphadenopathy:    He has no cervical adenopathy.  Neurological: He is alert and oriented to person, place, and time. He exhibits normal muscle tone.  Skin: Skin is warm and dry.  Psychiatric: He has a normal mood and affect.  Nursing note and vitals reviewed.   ED Course  Procedures (including critical care time) Labs Review Labs Reviewed - No data to display  Imaging Review No results found.   MDM   1. Gastroenteritis presumed infectious    There has been no vomiting today. May take Zofran for nausea and/or vomiting as directed. Clear liquids for 24 hours and slowly advance diet as tolerated. Imodium A-D take 1 now May repeat in 4  hours if still having several bowel movements. Do not take more than 3 per day . Once the diarrhea has slowed down stop the Imodium. 4 worsening. Unable to keep liquids down, continued vomiting, bloody stools or frequent unrelenting diarrhea seek medical attention promptly.   Hayden Rasmussenavid Shacora Zynda, NP 05/30/14 [phone]

## 2014-05-30 NOTE — ED Notes (Signed)
Pt had homemade tacos made by meat that was marked down in price at the grocery store on Wednesday night.  He only had one episode of vomiting yesterday, but has been suffering from diarrhea, chills, nausea, weakness, and abdominal cramping since.

## 2014-05-30 NOTE — Discharge Instructions (Signed)
Diarrhea °Diarrhea is frequent loose and watery bowel movements. It can cause you to feel weak and dehydrated. Dehydration can cause you to become tired and thirsty, have a dry mouth, and have decreased urination that often is dark yellow. Diarrhea is a sign of another problem, most often an infection that will not last long. In most cases, diarrhea typically lasts 2-3 days. However, it can last longer if it is a sign of something more serious. It is important to treat your diarrhea as directed by your caregiver to lessen or prevent future episodes of diarrhea. °CAUSES  °Some common causes include: °· Gastrointestinal infections caused by viruses, bacteria, or parasites. °· Food poisoning or food allergies. °· Certain medicines, such as antibiotics, chemotherapy, and laxatives. °· Artificial sweeteners and fructose. °· Digestive disorders. °HOME CARE INSTRUCTIONS °· Ensure adequate fluid intake (hydration): Have 1 cup (8 oz) of fluid for each diarrhea episode. Avoid fluids that contain simple sugars or sports drinks, fruit juices, whole milk products, and sodas. Your urine should be clear or pale yellow if you are drinking enough fluids. Hydrate with an oral rehydration solution that you can purchase at pharmacies, retail stores, and online. You can prepare an oral rehydration solution at home by mixing the following ingredients together: °¨  - tsp table salt. °¨ ¾ tsp baking soda. °¨  tsp salt substitute containing potassium chloride. °¨ 1  tablespoons sugar. °¨ 1 L (34 oz) of water. °· Certain foods and beverages may increase the speed at which food moves through the gastrointestinal (GI) tract. These foods and beverages should be avoided and include: °¨ Caffeinated and alcoholic beverages. °¨ High-fiber foods, such as raw fruits and vegetables, nuts, seeds, and whole grain breads and cereals. °¨ Foods and beverages sweetened with sugar alcohols, such as xylitol, sorbitol, and mannitol. °· Some foods may be well  tolerated and may help thicken stool including: °¨ Starchy foods, such as rice, toast, pasta, low-sugar cereal, oatmeal, grits, baked potatoes, crackers, and bagels. °¨ Bananas. °¨ Applesauce. °· Add probiotic-rich foods to help increase healthy bacteria in the GI tract, such as yogurt and fermented milk products. °· Wash your hands well after each diarrhea episode. °· Only take over-the-counter or prescription medicines as directed by your caregiver. °· Take a warm bath to relieve any burning or pain from frequent diarrhea episodes. °SEEK IMMEDIATE MEDICAL CARE IF:  °· You are unable to keep fluids down. °· You have persistent vomiting. °· You have blood in your stool, or your stools are black and tarry. °· You do not urinate in 6-8 hours, or there is only a small amount of very dark urine. °· You have abdominal pain that increases or localizes. °· You have weakness, dizziness, confusion, or light-headedness. °· You have a severe headache. °· Your diarrhea gets worse or does not get better. °· You have a fever or persistent symptoms for more than 2-3 days. °· You have a fever and your symptoms suddenly get worse. °MAKE SURE YOU:  °· Understand these instructions. °· Will watch your condition. °· Will get help right away if you are not doing well or get worse. °Document Released: 03/25/2002 Document Revised: 08/19/2013 Document Reviewed: 12/11/2011 °ExitCare® Patient Information ©2015 [employer]. This information is not intended to replace advice given to you by your health care provider. Make sure you discuss any questions you have with your health care provider. ° °Viral Gastroenteritis °Viral gastroenteritis is also known as stomach flu. This condition affects the stomach and   intestinal tract. It can cause sudden diarrhea and vomiting. The illness typically lasts 3 to 8 days. Most people develop an immune response that eventually gets rid of the virus. While this natural response develops, the virus can make  you quite ill. °CAUSES  °Many different viruses can cause gastroenteritis, such as rotavirus or noroviruses. You can catch one of these viruses by consuming contaminated food or water. You may also catch a virus by sharing utensils or other personal items with an infected person or by touching a contaminated surface. °SYMPTOMS  °The most common symptoms are diarrhea and vomiting. These problems can cause a severe loss of body fluids (dehydration) and a body salt (electrolyte) imbalance. Other symptoms may include: °· Fever. °· Headache. °· Fatigue. °· Abdominal pain. °DIAGNOSIS  °Your caregiver can usually diagnose viral gastroenteritis based on your symptoms and a physical exam. A stool sample may also be taken to test for the presence of viruses or other infections. °TREATMENT  °This illness typically goes away on its own. Treatments are aimed at rehydration. The most serious cases of viral gastroenteritis involve vomiting so severely that you are not able to keep fluids down. In these cases, fluids must be given through an intravenous line (IV). °HOME CARE INSTRUCTIONS  °· Drink enough fluids to keep your urine clear or pale yellow. Drink small amounts of fluids frequently and increase the amounts as tolerated. °· Ask your caregiver for specific rehydration instructions. °· Avoid: °¨ Foods high in sugar. °¨ Alcohol. °¨ Carbonated drinks. °¨ Tobacco. °¨ Juice. °¨ Caffeine drinks. °¨ Extremely hot or cold fluids. °¨ Fatty, greasy foods. °¨ Too much intake of anything at one time. °¨ Dairy products until 24 to 48 hours after diarrhea stops. °· You may consume probiotics. Probiotics are active cultures of beneficial bacteria. They may lessen the amount and number of diarrheal stools in adults. Probiotics can be found in yogurt with active cultures and in supplements. °· Wash your hands well to avoid spreading the virus. °· Only take over-the-counter or prescription medicines for pain, discomfort, or fever as directed  by your caregiver. Do not give aspirin to children. Antidiarrheal medicines are not recommended. °· Ask your caregiver if you should continue to take your regular prescribed and over-the-counter medicines. °· Keep all follow-up appointments as directed by your caregiver. °SEEK IMMEDIATE MEDICAL CARE IF:  °· You are unable to keep fluids down. °· You do not urinate at least once every 6 to 8 hours. °· You develop shortness of breath. °· You notice blood in your stool or vomit. This may look like coffee grounds. °· You have abdominal pain that increases or is concentrated in one small area (localized). °· You have persistent vomiting or diarrhea. °· You have a fever. °· The patient is a child younger than 3 months, and he or she has a fever. °· The patient is a child older than 3 months, and he or she has a fever and persistent symptoms. °· The patient is a child older than 3 months, and he or she has a fever and symptoms suddenly get worse. °· The patient is a baby, and he or she has no tears when crying. °MAKE SURE YOU:  °· Understand these instructions. °· Will watch your condition. °· Will get help right away if you are not doing well or get worse. °Document Released: 04/04/2005 Document Revised: 06/27/2011 Document Reviewed: 01/19/2011 °ExitCare® Patient Information ©2015 [employer]. This information is not intended to replace advice given   to you by your health care provider. Make sure you discuss any questions you have with your health care provider. ° °

## 2015-05-25 ENCOUNTER — Emergency Department (HOSPITAL_COMMUNITY)
Admission: EM | Admit: 2015-05-25 | Discharge: 2015-05-25 | Disposition: A | Discharge status: Home / Self Care | Payer: Self-pay | Attending: Emergency Medicine | Admitting: Emergency Medicine

## 2015-05-25 ENCOUNTER — Encounter (HOSPITAL_COMMUNITY): Payer: Self-pay | Admitting: *Deleted

## 2015-05-25 DIAGNOSIS — R42 Dizziness and giddiness: Secondary | ICD-10-CM | POA: Insufficient documentation

## 2015-05-25 DIAGNOSIS — R110 Nausea: Secondary | ICD-10-CM | POA: Insufficient documentation

## 2015-05-25 DIAGNOSIS — F17200 Nicotine dependence, unspecified, uncomplicated: Secondary | ICD-10-CM | POA: Insufficient documentation

## 2015-05-25 LAB — COMPREHENSIVE METABOLIC PANEL
ALT: 8 U/L — ABNORMAL LOW (ref 17–63)
AST: 18 U/L (ref 15–41)
Albumin: 4 g/dL (ref 3.5–5.0)
Alkaline Phosphatase: 50 U/L (ref 38–126)
Anion gap: 12 (ref 5–15)
BUN: 8 mg/dL (ref 6–20)
CO2: 24 mmol/L (ref 22–32)
Calcium: 9.3 mg/dL (ref 8.9–10.3)
Chloride: 105 mmol/L (ref 101–111)
Creatinine, Ser: 1.05 mg/dL (ref 0.61–1.24)
GFR calc Af Amer: >60 mL/min (ref >60)
GFR calc non Af Amer: >60 mL/min (ref >60)
Glucose, Bld: 139 mg/dL — ABNORMAL HIGH (ref 65–99)
Potassium: 4.1 mmol/L (ref 3.5–5.1)
Sodium: 141 mmol/L (ref 135–145)
Total Bilirubin: 0.7 mg/dL (ref 0.3–1.2)
Total Protein: 6.5 g/dL (ref 6.5–8.1)

## 2015-05-25 LAB — CBC
HCT: 44.7 % (ref 39.0–52.0)
Hemoglobin: 15.5 g/dL (ref 13.0–17.0)
MCH: 30.3 pg (ref 26.0–34.0)
MCHC: 34.7 g/dL (ref 30.0–36.0)
MCV: 87.3 fL (ref 78.0–100.0)
Platelets: 192 10*3/uL (ref 150–400)
RBC: 5.12 MIL/uL (ref 4.22–5.81)
RDW: 12.9 % (ref 11.5–15.5)
WBC: 6.1 10*3/uL (ref 4.0–10.5)

## 2015-05-25 LAB — LIPASE, BLOOD: Lipase: 22 U/L (ref 11–51)

## 2015-05-25 MED ORDER — ONDANSETRON 4 MG PO TBDP
4.0000 mg | ORAL_TABLET | Freq: Once | ORAL | Status: AC | PRN
  Administered 2015-05-25: 4 mg via ORAL

## 2015-05-25 MED ORDER — ONDANSETRON 4 MG PO TBDP
ORAL_TABLET | ORAL | Status: AC
  Filled 2015-05-25: qty 1

## 2015-05-25 NOTE — ED Notes (Signed)
No response for room assignment.  

## 2015-05-25 NOTE — ED Notes (Signed)
No response for VS reassessment.  

## 2015-05-25 NOTE — ED Notes (Signed)
Pt reports waking up this am with dizziness, feels like room is spinning and n/v.

## 2015-06-28 ENCOUNTER — Encounter (HOSPITAL_BASED_OUTPATIENT_CLINIC_OR_DEPARTMENT_OTHER): Payer: Self-pay | Admitting: Emergency Medicine

## 2015-06-28 ENCOUNTER — Emergency Department (HOSPITAL_BASED_OUTPATIENT_CLINIC_OR_DEPARTMENT_OTHER)
Admission: EM | Admit: 2015-06-28 | Discharge: 2015-06-29 | Disposition: A | Discharge status: Home / Self Care | Payer: Self-pay | Attending: Emergency Medicine | Admitting: Emergency Medicine

## 2015-06-28 DIAGNOSIS — R1032 Left lower quadrant pain: Secondary | ICD-10-CM | POA: Insufficient documentation

## 2015-06-28 DIAGNOSIS — Z791 Long term (current) use of non-steroidal anti-inflammatories (NSAID): Secondary | ICD-10-CM | POA: Insufficient documentation

## 2015-06-28 DIAGNOSIS — K219 Gastro-esophageal reflux disease without esophagitis: Secondary | ICD-10-CM | POA: Insufficient documentation

## 2015-06-28 DIAGNOSIS — F17200 Nicotine dependence, unspecified, uncomplicated: Secondary | ICD-10-CM | POA: Insufficient documentation

## 2015-06-28 DIAGNOSIS — R350 Frequency of micturition: Secondary | ICD-10-CM | POA: Insufficient documentation

## 2015-06-28 LAB — URINALYSIS, ROUTINE W REFLEX MICROSCOPIC
Bilirubin Urine: NEGATIVE
GLUCOSE, UA: NEGATIVE mg/dL
Ketones, ur: NEGATIVE mg/dL
LEUKOCYTES UA: NEGATIVE
Nitrite: NEGATIVE
PH: 6.5 (ref 5.0–8.0)
Protein, ur: NEGATIVE mg/dL
SPECIFIC GRAVITY, URINE: 1.012 (ref 1.005–1.030)

## 2015-06-28 LAB — URINE MICROSCOPIC-ADD ON

## 2015-06-28 LAB — CBG MONITORING, ED: Glucose-Capillary: 88 mg/dL (ref 65–99)

## 2015-06-28 NOTE — ED Notes (Signed)
Patient states that he is having pain to his left upper quadrant area. For the last 2 -3 months he has been urinating a lot more frequently

## 2015-06-29 ENCOUNTER — Emergency Department (HOSPITAL_BASED_OUTPATIENT_CLINIC_OR_DEPARTMENT_OTHER): Payer: Self-pay

## 2015-06-29 LAB — BASIC METABOLIC PANEL
Anion gap: 7 (ref 5–15)
BUN: 8 mg/dL (ref 6–20)
CO2: 25 mmol/L (ref 22–32)
Calcium: 8.9 mg/dL (ref 8.9–10.3)
Chloride: 110 mmol/L (ref 101–111)
Creatinine, Ser: 1.08 mg/dL (ref 0.61–1.24)
GFR calc Af Amer: >60 mL/min (ref >60)
GFR calc non Af Amer: >60 mL/min (ref >60)
Glucose, Bld: 126 mg/dL — ABNORMAL HIGH (ref 65–99)
Potassium: 3.8 mmol/L (ref 3.5–5.1)
Sodium: 142 mmol/L (ref 135–145)

## 2015-06-29 LAB — CBC WITH DIFFERENTIAL/PLATELET
Basophils Absolute: 0 10*3/uL (ref 0.0–0.1)
Basophils Relative: 0 %
Eosinophils Absolute: 0.3 10*3/uL (ref 0.0–0.7)
Eosinophils Relative: 2 %
HCT: 42.1 % (ref 39.0–52.0)
Hemoglobin: 14.4 g/dL (ref 13.0–17.0)
Lymphocytes Relative: 26 %
Lymphs Abs: 3 10*3/uL (ref 0.7–4.0)
MCH: 29.9 pg (ref 26.0–34.0)
MCHC: 34.2 g/dL (ref 30.0–36.0)
MCV: 87.5 fL (ref 78.0–100.0)
Monocytes Absolute: 0.8 10*3/uL (ref 0.1–1.0)
Monocytes Relative: 7 %
Neutro Abs: 7.7 10*3/uL (ref 1.7–7.7)
Neutrophils Relative %: 65 %
Platelets: 167 10*3/uL (ref 150–400)
RBC: 4.81 MIL/uL (ref 4.22–5.81)
RDW: 13.1 % (ref 11.5–15.5)
WBC: 11.8 10*3/uL — ABNORMAL HIGH (ref 4.0–10.5)

## 2015-06-29 LAB — LIPASE, BLOOD: Lipase: 23 U/L (ref 11–51)

## 2015-06-29 LAB — AMYLASE: Amylase: 106 U/L — ABNORMAL HIGH (ref 28–100)

## 2015-06-29 MED ORDER — IOHEXOL 300 MG/ML  SOLN
25.0000 mL | Freq: Once | INTRAMUSCULAR | Status: AC | PRN
  Administered 2015-06-29: 25 mL via ORAL

## 2015-06-29 MED ORDER — ONDANSETRON HCL 4 MG/2ML IJ SOLN
4.0000 mg | Freq: Once | INTRAMUSCULAR | Status: AC
  Administered 2015-06-29: 4 mg via INTRAVENOUS
  Filled 2015-06-29: qty 2

## 2015-06-29 MED ORDER — MORPHINE SULFATE (PF) 4 MG/ML IV SOLN
4.0000 mg | Freq: Once | INTRAVENOUS | Status: AC
  Administered 2015-06-29: 4 mg via INTRAVENOUS
  Filled 2015-06-29: qty 1

## 2015-06-29 MED ORDER — GI COCKTAIL ~~LOC~~
30.0000 mL | Freq: Once | ORAL | Status: AC
  Administered 2015-06-29: 30 mL via ORAL
  Filled 2015-06-29: qty 30

## 2015-06-29 MED ORDER — IOHEXOL 350 MG/ML SOLN
100.0000 mL | Freq: Once | INTRAVENOUS | Status: AC | PRN
  Administered 2015-06-29: 100 mL via INTRAVENOUS

## 2015-06-29 NOTE — Discharge Instructions (Signed)

## 2015-06-29 NOTE — ED Notes (Signed)
To CT

## 2015-06-29 NOTE — ED Provider Notes (Signed)
CSN: [id number]     Arrival date & time 06/28/15  2136 History   First MD Initiated Contact with Patient 06/28/15 2358     Chief Complaint  Patient presents with  . Abdominal Pain     (Consider location/radiation/quality/duration/timing/severity/associated sxs/prior Treatment) HPI Comments: Alan Richardson is a 30 y.o. male who presents today with abdominal pain. The pain began 9 hours ago. Pain is located in the LLQ with some radiation to flank. The pain is described as constant sharp, throbbing and is 4/10 in intensity now and was 10/10 at times today. At times, he felt like he could not move or walk because of the pain. Symptoms have been gradually improving since onset. Aggravating factors include bending forward and bending to the left.  Alleviating factors include recumbency. Patient has tried Tylenol, Pepto Bismol, and ginger ale without relief. The patient endorses increased urination for the past 2-3 months. No pain or burning or frank blood. Patient admits to holding stool during the day and waiting to pass BMs when he is at home. The patient denies fever, chills, chest pain, shortness of breath, constipation, diarrhea, dysuria, headache, hematochezia, hematuria, melena, nausea and vomiting. Patient has appetite now. Last ate last night. Patient drinks around 6 drinks per week, on the weekends.    Patient is a 30 y.o. male presenting with abdominal pain. The history is provided by the patient. No language interpreter was used.  Abdominal Pain Associated symptoms: no chest pain, no chills, no constipation, no diarrhea, no dysuria, no fever, no nausea, no shortness of breath, no sore throat and no vomiting     Past Medical History  Diagnosis Date  . Reflux    History reviewed. No pertinent past surgical history. History reviewed. No pertinent family history. Social History  Substance Use Topics  . Smoking status: Current Every Day Smoker -- 0.25 packs/day  . Smokeless tobacco: None    . Alcohol Use: Yes     Comment: on weekends    Review of Systems  Constitutional: Negative for fever and chills.  HENT: Negative for facial swelling and sore throat.   Eyes: Negative for discharge.  Respiratory: Negative for shortness of breath.   Cardiovascular: Negative for chest pain.  Gastrointestinal: Positive for abdominal pain. Negative for nausea, vomiting, diarrhea, constipation, blood in stool and abdominal distention.  Genitourinary: Positive for frequency. Negative for dysuria, decreased urine volume, discharge, penile swelling, penile pain and testicular pain.  Musculoskeletal: Negative for back pain.  Skin: Negative for rash and wound.  Neurological: Negative for headaches.  Psychiatric/Behavioral: The patient is not nervous/anxious.       Allergies  Review of patient's allergies indicates no known allergies.  Home Medications   Prior to Admission medications   Medication Sig Start Date End Date Taking? Authorizing Provider  acetaminophen (TYLENOL) 500 MG tablet Take 1,500-2,000 mg by mouth every 6 (six) hours as needed for moderate pain.    Historical Provider, MD  diclofenac (VOLTAREN) 50 MG EC tablet Take 1 tablet (50 mg total) by mouth 2 (two) times daily. 09/16/13   Antony Madura, PA-C  meclizine (ANTIVERT) 12.5 MG tablet Take 1 tablet (12.5 mg total) by mouth 3 (three) times daily as needed for dizziness. 09/16/13   Antony Madura, PA-C  ondansetron (ZOFRAN) 4 MG tablet Take 1 tablet (4 mg total) by mouth every 6 (six) hours. 05/30/14   Hayden Rasmussen, NP  Oxycodone HCl 10 MG TABS Take 10 mg by mouth once.    Historical Provider,  MD  Ranitidine HCl (ZANTAC 75 PO) Take by mouth.    Historical Provider, MD  traMADol (ULTRAM) 50 MG tablet Take 1 tablet (50 mg total) by mouth every 6 (six) hours as needed. 12/01/13   Heather Laisure, PA-C   BP 149/94 mmHg  Pulse 61  Temp(Src) 98.2 F (36.8 C) (Oral)  Resp 18  Ht 5\' 9"  (1.753 m)  Wt 90.719 kg  BMI 29.52 kg/m2  SpO2  99% Physical Exam  Constitutional: He appears well-developed and well-nourished. No distress.  HENT:  Head: Normocephalic and atraumatic.  Eyes: Conjunctivae are normal. Right eye exhibits no discharge. Left eye exhibits no discharge. No scleral icterus.  Neck: Normal range of motion. Neck supple.  Cardiovascular: Normal rate, regular rhythm and normal heart sounds.  Exam reveals no gallop and no friction rub.   No murmur heard. Pulmonary/Chest: Effort normal and breath sounds normal. No stridor. No respiratory distress. He has no wheezes. He has no rales.  Abdominal: Soft. Normal appearance and bowel sounds are normal. He exhibits no distension, no abdominal bruit and no pulsatile midline mass. There is no hepatosplenomegaly. There is tenderness. There is no rigidity, no rebound, no guarding, no CVA tenderness, no tenderness at McBurney's point and negative Murphy's sign. Hernia confirmed negative in the right inguinal area and confirmed negative in the left inguinal area.    Genitourinary: Testes normal and penis normal. Circumcised. No penile erythema or penile tenderness. No discharge found.  Musculoskeletal: He exhibits no edema.  Neurological: He is alert. Coordination normal.  Skin: Skin is warm and dry. No rash noted. He is not diaphoretic. No pallor.  Psychiatric: He has a normal mood and affect.  Nursing note and vitals reviewed.   ED Course  Procedures (including critical care time) Labs Review Labs Reviewed  URINALYSIS, ROUTINE W REFLEX MICROSCOPIC (NOT AT [facility]) - Abnormal; Notable for the following:    Hgb urine dipstick LARGE (*)    All other components within normal limits  URINE MICROSCOPIC-ADD ON - Abnormal; Notable for the following:    Squamous Epithelial / LPF 0-5 (*)    Bacteria, UA RARE (*)    All other components within normal limits  URINE CULTURE  AMYLASE  BASIC METABOLIC PANEL  LIPASE, BLOOD  LACTIC ACID, PLASMA  CBC WITH DIFFERENTIAL/PLATELET  CBG  MONITORING, ED    Imaging Review No results found. I have personally reviewed and evaluated these images and lab results as part of my medical decision-making.   EKG Interpretation None      MDM    Joette CatchingBobby L Schillaci is a 30 y.o. male who presents today with LLQ and flank pain. At shift change, patient care transferred to Dr. Read DriversMolpus for continued evaluation, follow up of CT Abd/Pelv, and labs and determination of disposition. Large hematuria. Mild elevation in WBC. Suspect stone. Gave report to Dr. Read DriversMolpus who will assume care.    Final diagnoses:  None      Emi Holeslexandra M Bryar Rennie, PA-C 06/29/15 0139  Paula LibraJohn Molpus, MD 06/29/15 [phone]

## 2015-06-29 NOTE — ED Notes (Signed)
C/o LLQ pain, onset 1500 constant for ~ 6 hrs, decreased around 2200, took pepto at 1500, took tylenol at 1600, last BM 1500, last ate Saturday night. (denies: nvd, urinary sx, fever, bleeding or constipation), no h/o same or of surgeries.

## 2015-06-30 LAB — URINE CULTURE: Culture: 1000

## 2017-02-22 ENCOUNTER — Other Ambulatory Visit: Payer: Self-pay

## 2017-02-22 ENCOUNTER — Encounter (HOSPITAL_COMMUNITY): Payer: Self-pay | Admitting: Emergency Medicine

## 2017-02-22 DIAGNOSIS — R51 Headache: Secondary | ICD-10-CM | POA: Insufficient documentation

## 2017-02-22 DIAGNOSIS — Y998 Other external cause status: Secondary | ICD-10-CM | POA: Diagnosis not present

## 2017-02-22 DIAGNOSIS — M25511 Pain in right shoulder: Secondary | ICD-10-CM | POA: Diagnosis not present

## 2017-02-22 DIAGNOSIS — Y92410 Unspecified street and highway as the place of occurrence of the external cause: Secondary | ICD-10-CM | POA: Insufficient documentation

## 2017-02-22 DIAGNOSIS — S39012A Strain of muscle, fascia and tendon of lower back, initial encounter: Secondary | ICD-10-CM | POA: Insufficient documentation

## 2017-02-22 DIAGNOSIS — Z79899 Other long term (current) drug therapy: Secondary | ICD-10-CM | POA: Diagnosis not present

## 2017-02-22 DIAGNOSIS — S3992XA Unspecified injury of lower back, initial encounter: Secondary | ICD-10-CM | POA: Diagnosis present

## 2017-02-22 DIAGNOSIS — Y9389 Activity, other specified: Secondary | ICD-10-CM | POA: Insufficient documentation

## 2017-02-22 DIAGNOSIS — F17200 Nicotine dependence, unspecified, uncomplicated: Secondary | ICD-10-CM | POA: Diagnosis not present
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
## 2017-02-23 ENCOUNTER — Emergency Department (HOSPITAL_COMMUNITY): Payer: No Typology Code available for payment source

## 2017-02-23 ENCOUNTER — Emergency Department (HOSPITAL_COMMUNITY)
Admission: EM | Admit: 2017-02-23 | Discharge: 2017-02-23 | Disposition: A | Discharge status: Home / Self Care | Payer: No Typology Code available for payment source | Attending: Emergency Medicine | Admitting: Emergency Medicine

## 2017-02-23 DIAGNOSIS — S39012A Strain of muscle, fascia and tendon of lower back, initial encounter: Secondary | ICD-10-CM

## 2017-02-23 DIAGNOSIS — M25511 Pain in right shoulder: Secondary | ICD-10-CM

## 2017-02-23 MED ORDER — METHOCARBAMOL 500 MG PO TABS: 500.0000 mg | ORAL_TABLET | Freq: Two times a day (BID) | ORAL | 0 refills | Status: DC

## 2017-02-23 MED ORDER — NAPROXEN 500 MG PO TABS: 500.0000 mg | ORAL_TABLET | Freq: Two times a day (BID) | ORAL | 0 refills | Status: DC
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
## 2017-02-23 NOTE — Discharge Instructions (Signed)
Imaging studies were normal. You may be sore for a few days which is normal.  Can take the prescribed medications to help with symptoms. Follow-up with your primary care doctor. Return to the ED for new or worsening symptoms.

## 2017-10-25 ENCOUNTER — Other Ambulatory Visit (HOSPITAL_BASED_OUTPATIENT_CLINIC_OR_DEPARTMENT_OTHER): Payer: Self-pay

## 2017-10-25 DIAGNOSIS — G4730 Sleep apnea, unspecified: Secondary | ICD-10-CM

## 2017-10-25 DIAGNOSIS — R0683 Snoring: Secondary | ICD-10-CM

## 2017-10-30 ENCOUNTER — Other Ambulatory Visit: Payer: Self-pay

## 2017-10-30 ENCOUNTER — Emergency Department (HOSPITAL_COMMUNITY)
Admission: EM | Admit: 2017-10-30 | Discharge: 2017-10-30 | Disposition: A | Discharge status: Home / Self Care | Payer: Self-pay | Attending: Emergency Medicine | Admitting: Emergency Medicine

## 2017-10-30 ENCOUNTER — Emergency Department (HOSPITAL_COMMUNITY): Payer: Self-pay

## 2017-10-30 ENCOUNTER — Encounter (HOSPITAL_COMMUNITY): Payer: Self-pay | Admitting: Emergency Medicine

## 2017-10-30 DIAGNOSIS — Z79899 Other long term (current) drug therapy: Secondary | ICD-10-CM | POA: Insufficient documentation

## 2017-10-30 DIAGNOSIS — M79641 Pain in right hand: Secondary | ICD-10-CM | POA: Insufficient documentation

## 2017-10-30 DIAGNOSIS — F17210 Nicotine dependence, cigarettes, uncomplicated: Secondary | ICD-10-CM | POA: Insufficient documentation

## 2017-10-30 DIAGNOSIS — Y929 Unspecified place or not applicable: Secondary | ICD-10-CM | POA: Insufficient documentation

## 2017-10-30 DIAGNOSIS — M542 Cervicalgia: Secondary | ICD-10-CM | POA: Insufficient documentation

## 2017-10-30 DIAGNOSIS — Y998 Other external cause status: Secondary | ICD-10-CM | POA: Insufficient documentation

## 2017-10-30 DIAGNOSIS — Y9389 Activity, other specified: Secondary | ICD-10-CM | POA: Insufficient documentation

## 2017-10-30 DIAGNOSIS — M545 Low back pain: Secondary | ICD-10-CM | POA: Insufficient documentation

## 2017-10-30 NOTE — Discharge Instructions (Addendum)
X-rays look good! No broken bones or dislocations.  Tylenol and/or Ibuprofen/Naproxen may be taken to help with your muscle soreness and inflammation that you have. You may also use warm or cold compresses on your body as well for additional relief.

## 2017-10-30 NOTE — ED Notes (Signed)
See provider assessment 
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
## 2017-11-30 ENCOUNTER — Ambulatory Visit (HOSPITAL_BASED_OUTPATIENT_CLINIC_OR_DEPARTMENT_OTHER): Payer: Self-pay | Attending: Specialist | Admitting: Internal Medicine

## 2017-11-30 VITALS — Ht 69.0 in | Wt 225.0 lb

## 2017-11-30 DIAGNOSIS — G4733 Obstructive sleep apnea (adult) (pediatric): Secondary | ICD-10-CM | POA: Insufficient documentation

## 2017-11-30 DIAGNOSIS — G4730 Sleep apnea, unspecified: Secondary | ICD-10-CM

## 2017-11-30 DIAGNOSIS — R0683 Snoring: Secondary | ICD-10-CM

## 2017-11-30 DIAGNOSIS — G4710 Hypersomnia, unspecified: Secondary | ICD-10-CM | POA: Insufficient documentation

## 2017-11-30 DIAGNOSIS — Z79899 Other long term (current) drug therapy: Secondary | ICD-10-CM | POA: Insufficient documentation

## 2017-12-01 ENCOUNTER — Ambulatory Visit (HOSPITAL_COMMUNITY)
Admission: EM | Admit: 2017-12-01 | Discharge: 2017-12-01 | Disposition: A | Discharge status: Home / Self Care | Payer: Self-pay | Attending: Family Medicine | Admitting: Family Medicine

## 2017-12-01 ENCOUNTER — Encounter (HOSPITAL_COMMUNITY): Payer: Self-pay

## 2017-12-01 DIAGNOSIS — R300 Dysuria: Secondary | ICD-10-CM | POA: Insufficient documentation

## 2017-12-01 DIAGNOSIS — Z79899 Other long term (current) drug therapy: Secondary | ICD-10-CM | POA: Insufficient documentation

## 2017-12-01 DIAGNOSIS — Z760 Encounter for issue of repeat prescription: Secondary | ICD-10-CM

## 2017-12-01 DIAGNOSIS — F17210 Nicotine dependence, cigarettes, uncomplicated: Secondary | ICD-10-CM | POA: Insufficient documentation

## 2017-12-01 DIAGNOSIS — A6001 Herpesviral infection of penis: Secondary | ICD-10-CM | POA: Insufficient documentation

## 2017-12-01 DIAGNOSIS — Z113 Encounter for screening for infections with a predominantly sexual mode of transmission: Secondary | ICD-10-CM

## 2017-12-01 DIAGNOSIS — Z202 Contact with and (suspected) exposure to infections with a predominantly sexual mode of transmission: Secondary | ICD-10-CM

## 2017-12-01 DIAGNOSIS — R369 Urethral discharge, unspecified: Secondary | ICD-10-CM | POA: Insufficient documentation

## 2017-12-01 DIAGNOSIS — K219 Gastro-esophageal reflux disease without esophagitis: Secondary | ICD-10-CM | POA: Insufficient documentation

## 2017-12-01 LAB — POCT URINALYSIS DIP (DEVICE)
BILIRUBIN URINE: NEGATIVE
Glucose, UA: NEGATIVE mg/dL
KETONES UR: NEGATIVE mg/dL
Nitrite: NEGATIVE
PH: 7 (ref 5.0–8.0)
Protein, ur: NEGATIVE mg/dL
SPECIFIC GRAVITY, URINE: 1.025 (ref 1.005–1.030)
Urobilinogen, UA: 0.2 mg/dL (ref 0.0–1.0)

## 2017-12-01 MED ORDER — CEFTRIAXONE SODIUM 250 MG IJ SOLR
INTRAMUSCULAR | Status: AC
  Filled 2017-12-01: qty 250

## 2017-12-01 MED ORDER — AZITHROMYCIN 250 MG PO TABS
1000.0000 mg | ORAL_TABLET | Freq: Once | ORAL | Status: AC
  Administered 2017-12-01: 1000 mg via ORAL

## 2017-12-01 MED ORDER — CEFTRIAXONE SODIUM 250 MG IJ SOLR
250.0000 mg | Freq: Once | INTRAMUSCULAR | Status: AC
  Administered 2017-12-01: 250 mg via INTRAMUSCULAR

## 2017-12-01 MED ORDER — VALACYCLOVIR HCL 1 G PO TABS: 1000.0000 mg | ORAL_TABLET | Freq: Every day | ORAL | 0 refills | Status: AC

## 2017-12-01 MED ORDER — LIDOCAINE HCL (PF) 1 % IJ SOLN
INTRAMUSCULAR | Status: AC
  Filled 2017-12-01: qty 2

## 2017-12-01 MED ORDER — AZITHROMYCIN 250 MG PO TABS
ORAL_TABLET | ORAL | Status: AC
  Filled 2017-12-01: qty 4

## 2017-12-01 NOTE — ED Provider Notes (Signed)
[facility]   SUBJECTIVE:  Alan Richardson is a 32 y.o. male who complains of intermittent dysuria and urethral discharge for the past 3 weeks.  Admits to having unprotected sex around the time of his symptoms.  Sexually active with 2 male partners.  Partner reports having chlamydia, gonorrhea, and trich.  Symptoms are made worse with urination.  Admits to similar symptoms in the past and diagnosed with gonorrhea and chlamydia.  Denies fever, chills, nausea, vomiting, abdominal pain, penile pain, testicular pain, testicular swelling, genital lesions or rash.    Patient also has hx of genital herpes.  Not currently experiencing an outbreak, but requesting a refill for prophylactic dose of valtrex.  PCP no longer practicing.    LMP: No LMP for male patient.  ROS: As in HPI.  Past Medical History:  Diagnosis Date  . Reflux    History reviewed. No pertinent surgical history. No Known Allergies No current facility-administered medications on file prior to encounter.    Current Outpatient Medications on File Prior to Encounter  Medication Sig Dispense Refill  . acetaminophen (TYLENOL) 500 MG tablet Take 1,500-2,000 mg by mouth every 6 (six) hours as needed for moderate pain.    Marland Kitchen. diclofenac (VOLTAREN) 50 MG EC tablet Take 1 tablet (50 mg total) by mouth 2 (two) times daily. 20 tablet 0  . methocarbamol (ROBAXIN) 500 MG tablet Take 1 tablet (500 mg total) 2 (two) times daily by mouth. 20 tablet 0  . naproxen (NAPROSYN) 500 MG tablet Take 1 tablet (500 mg total) 2 (two) times daily with a meal by mouth. 30 tablet 0  . Ranitidine HCl (ZANTAC 75 PO) Take by mouth.     Social History   Socioeconomic History  . Marital status: Single    Spouse name: Not on file  . Number of children: Not on file  . Years of education: Not on file  . Highest education level: Not on file  Occupational History  . Not on file  Social Needs  . Financial resource strain: Not on file  . Food  insecurity:    Worry: Not on file    Inability: Not on file  . Transportation needs:    Medical: Not on file    Non-medical: Not on file  Tobacco Use  . Smoking status: Current Every Day Smoker    Packs/day: 0.25  . Smokeless tobacco: Never Used  Substance and Sexual Activity  . Alcohol use: Yes    Comment: on weekends  . Drug use: No  . Sexual activity: Not on file  Lifestyle  . Physical activity:    Days per week: Not on file    Minutes per session: Not on file  . Stress: Not on file  Relationships  . Social connections:    Talks on phone: Not on file    Gets together: Not on file    Attends religious service: Not on file    Active member of club or organization: Not on file    Attends meetings of clubs or organizations: Not on file    Relationship status: Not on file  . Intimate partner violence:    Fear of current or ex partner: Not on file    Emotionally abused: Not on file    Physically abused: Not on file    Forced sexual activity: Not on file  Other Topics Concern  . Not on file  Social History Narrative  . Not on file   History reviewed. No  pertinent family history.  OBJECTIVE:  Vitals:   12/01/17 0955  BP: (!) 143/91  Pulse: 73  Resp: 20  Temp: 97.9 F (36.6 C)  TempSrc: Oral  SpO2: 97%   General appearance: AOx3 in no acute distress HEENT: NCAT.  Oropharynx clear.  Lungs: clear to auscultation bilaterally without adventitious breath sounds Heart: regular rate and rhythm.  Radial pulses 2+ symmetrical bilaterally Abdomen: soft; non-distended; nontender to palpation; bowel sounds present; no guarding GU: declines Back: no CVA tenderness Extremities: no edema; symmetrical with no gross deformities Skin: warm and dry Neurologic: Ambulates from chair to exam table without difficulty Psychological: alert and cooperative; normal mood and affect  Labs Reviewed  POCT URINALYSIS DIP (DEVICE) - Abnormal; Notable for the following components:       Result Value   Hgb urine dipstick TRACE (*)    Leukocytes, UA TRACE (*)    All other components within normal limits  HIV ANTIBODY (ROUTINE TESTING)  RPR  URINE CYTOLOGY ANCILLARY ONLY    ASSESSMENT & PLAN:  1. Dysuria   2. Penile discharge   3. STD exposure   4. Herpes simplex infection of penis     Meds ordered this encounter  Medications  . azithromycin (ZITHROMAX) tablet 1,000 mg  . cefTRIAXone (ROCEPHIN) injection 250 mg  . valACYclovir (VALTREX) 1000 MG tablet    Sig: Take 1 tablet (1,000 mg total) by mouth daily.    Dispense:  30 tablet    Refill:  0    Order Specific Question:   Supervising Provider    Answer:   Isa RankinMURRAY, LAURA WILSON [[id number]]   Given rocephin 250mg  injection and azithromycin 1g in office Urine cytology obtained HIV/ syphilis blood testing today Prescribed valtrex.  Take as prescribed.   We will follow up with you regarding the results of your test If tests are positive, please abstain from sexual activity for at least 7 days and notify partners PCP assistance initiated [facility] PCP [facility] persists  Return here or go to ER if you have any new or worsening symptoms    Outlined signs and symptoms indicating need for more acute intervention. Patient verbalized understanding. After Visit Summary given.     [name], [name], PA-C 12/01/17 1018

## 2017-12-01 NOTE — Discharge Instructions (Signed)
Given rocephin 250mg  injection and azithromycin 1g in office Urine cytology obtained HIV/ syphilis blood testing today Prescribed valtrex.  Take as prescribed.   We will follow up with you regarding the results of your test If tests are positive, please abstain from sexual activity for at least 7 days and notify partners PCP assistance initiated Please [facility] PCP [facility] symptoms persists Return here or go to ER if you have any new or worsening symptoms

## 2017-12-01 NOTE — ED Triage Notes (Signed)
Pt presents with burning during urination

## 2017-12-02 LAB — RPR: RPR Ser Ql: NONREACTIVE

## 2017-12-02 LAB — HIV ANTIBODY (ROUTINE TESTING W REFLEX): HIV SCREEN 4TH GENERATION: NONREACTIVE

## 2017-12-04 LAB — URINE CYTOLOGY ANCILLARY ONLY
CHLAMYDIA, DNA PROBE: POSITIVE — AB
Neisseria Gonorrhea: NEGATIVE
TRICH (WINDOWPATH): POSITIVE — AB

## 2017-12-07 ENCOUNTER — Telehealth (HOSPITAL_COMMUNITY): Payer: Self-pay

## 2017-12-07 MED ORDER — METRONIDAZOLE 500 MG PO TABS: 2000.0000 mg | ORAL_TABLET | Freq: Once | ORAL | 0 refills | Status: AC

## 2017-12-07 MED ORDER — METRONIDAZOLE 500 MG PO TABS: 2000.0000 mg | ORAL_TABLET | Freq: Once | ORAL | 0 refills | Status: DC

## 2017-12-07 NOTE — Telephone Encounter (Signed)
Chlamydia is positive.  This was treated at the urgent care visit with po zithromax 1g. Need to educate pt to please refrain from sexual intercourse for 7 days to give the medicine time to work.  Sexual partners need to be notified and tested/treated.  Condoms may reduce risk of reinfection.  Recheck or followup with PCP for further evaluation if symptoms are not improving.  [facility] notified  Trichomonas is positive. Rx metronidazole 2000 g po once was sent to the pharmacy of record. Need to educate patient to refrain from sexual intercourse for 7 days to give the medicine time to work. Sexual partners need to be notified and tested/treated. Condoms may reduce risk of reinfection. Recheck for further evaluation if symptoms are not improving.   Attempted to reach patient. No answer at this time. Voicemail left.

## 2017-12-07 NOTE — Telephone Encounter (Signed)
Pt aware of results and new rx. 

## 2017-12-17 DIAGNOSIS — R0683 Snoring: Secondary | ICD-10-CM

## 2017-12-17 NOTE — Procedures (Signed)
Patient Name: Alan Richardson, Wander Date: 11/30/2017 Gender: Male D.O.B: [birth year] Age (years): 31 Referring Provider: Lillia Corporal Height (inches): 27 Interpreting Physician: Baird Lyons MD, ABSM Weight (lbs): 225 RPSGT: Zadie Rhine BMI: 33 MRN: [id number] Neck Size: 18.00  CLINICAL INFORMATION Sleep Study Type: Split Night CPAP Indication for sleep study: OSA, Snoring  Epworth Sleepiness Score: 10  SLEEP STUDY TECHNIQUE As per the AASM Manual for the Scoring of Sleep and Associated Events v2.3 (April 2016) with a hypopnea requiring 4% desaturations.  The channels recorded and monitored were frontal, central and occipital EEG, electrooculogram (EOG), submentalis EMG (chin), nasal and oral airflow, thoracic and abdominal wall motion, anterior tibialis EMG, snore microphone, electrocardiogram, and pulse oximetry. Continuous positive airway pressure (CPAP) was initiated when the patient met split night criteria and was titrated according to treat sleep-disordered breathing.  MEDICATIONS Medications self-administered by patient taken the night of the study : OMEPRAZOLE  RESPIRATORY PARAMETERS Diagnostic  Total AHI (/hr): 105.7 RDI (/hr): 105.7 OA Index (/hr): 28.1 CA Index (/hr): 1.9 REM AHI (/hr): 91.4 NREM AHI (/hr): 107.0 Supine AHI (/hr): 107.1 Non-supine AHI (/hr): 102.1 Min O2 Sat (%): 75.0 Mean O2 (%): 89.3 Time below 88% (min): 53.7   Titration  Optimal Pressure (cm): 16 AHI at Optimal Pressure (/hr): 0.0 Min O2 at Optimal Pressure (%): 94.0 Supine % at Optimal (%): 100 Sleep % at Optimal (%): 100   SLEEP ARCHITECTURE The recording time for the entire night was 414.3 minutes.  During a baseline period of 225.9 minutes, the patient slept for 123.8 minutes in REM and nonREM, yielding a sleep efficiency of 54.8%%. Sleep onset after lights out was 100.7 minutes with a REM latency of 78.5 minutes. The patient spent 4.4%% of the night in stage N1 sleep, 87.1%%  in stage N2 sleep, 0.0%% in stage N3 and 8.5% in REM.  During the titration period of 184.8 minutes, the patient slept for 138.1 minutes in REM and nonREM, yielding a sleep efficiency of 74.7%%. Sleep onset after CPAP initiation was 36.2 minutes with a REM latency of 62.0 minutes. The patient spent 10.1%% of the night in stage N1 sleep, 66.0%% in stage N2 sleep, 0.0%% in stage N3 and 23.9% in REM.  CARDIAC DATA The 2 lead EKG demonstrated sinus rhythm. The mean heart rate was 100.0 beats per minute. Other EKG findings include: None.  LEG MOVEMENT DATA The total Periodic Limb Movements of Sleep (PLMS) were 0. The PLMS index was 0.0 .  IMPRESSIONS - Severe obstructive sleep apnea occurred during the diagnostic portion of the study (AHI = 105.7/hour). An optimal PAP pressure was selected for this patient ( 16 cm of water) - No significant central sleep apnea occurred during the diagnostic portion of the study (CAI = 1.9/hour). - The patient had minimal or no oxygen desaturation during the diagnostic portion of the study (Min O2 = 75.0%) - No snoring was audible during the diagnostic portion of the study. - No cardiac abnormalities were noted during this study. - Clinically significant periodic limb movements did not occur during sleep.  DIAGNOSIS - Obstructive Sleep Apnea (327.23 [G47.33 ICD-10])  RECOMMENDATIONS - Trial of CPAP therapy on 16 cm H2O or DME autopap 10-20. Patient used a Medium size Fisher&Paykel Full Face Mask Simplus mask and heated humidification. - Be careful with alcohol, sedatives and other CNS depressants that may worsen sleep apnea and disrupt normal sleep architecture. - Sleep hygiene should be reviewed to assess factors that may improve sleep  quality. - Weight management and regular exercise should be initiated or continued.  [Electronically signed] 12/17/2017 04:14 PM  Baird Lyons MD, [place], American Board of Sleep Medicine   NPI: [id number]                         [place], [place] [facility]  ELECTRONICALLY SIGNED ON:  12/17/2017, 4:12 PM [place] PH: [phone]   FX: [phone] [place]

## 2018-04-14 ENCOUNTER — Emergency Department (HOSPITAL_COMMUNITY)
Admission: EM | Admit: 2018-04-14 | Discharge: 2018-04-14 | Disposition: A | Discharge status: Home / Self Care | Payer: Self-pay | Attending: Emergency Medicine | Admitting: Emergency Medicine

## 2018-04-14 ENCOUNTER — Other Ambulatory Visit: Payer: Self-pay

## 2018-04-14 DIAGNOSIS — R69 Illness, unspecified: Secondary | ICD-10-CM

## 2018-04-14 DIAGNOSIS — J111 Influenza due to unidentified influenza virus with other respiratory manifestations: Secondary | ICD-10-CM | POA: Insufficient documentation

## 2018-04-14 DIAGNOSIS — F17200 Nicotine dependence, unspecified, uncomplicated: Secondary | ICD-10-CM | POA: Insufficient documentation

## 2018-04-14 DIAGNOSIS — Z79899 Other long term (current) drug therapy: Secondary | ICD-10-CM | POA: Insufficient documentation

## 2018-04-14 MED ORDER — ACETAMINOPHEN 325 MG PO TABS
650.0000 mg | ORAL_TABLET | Freq: Once | ORAL | Status: AC
  Administered 2018-04-14: 650 mg via ORAL
  Filled 2018-04-14: qty 2

## 2018-04-14 MED ORDER — OSELTAMIVIR PHOSPHATE 75 MG PO CAPS: 75.0000 mg | ORAL_CAPSULE | Freq: Two times a day (BID) | ORAL | 0 refills | Status: DC

## 2018-04-14 NOTE — ED Notes (Signed)
Patient verbalizes understanding of discharge instructions. Opportunity for questioning and answers were provided. Armband removed by staff, pt discharged from ED.  

## 2018-04-14 NOTE — ED Provider Notes (Signed)
[name] Provider Note   CSN: [id number] Arrival date & time: 04/14/18  1934     History   Chief Complaint Chief Complaint  Patient presents with  . Generalized Body Aches    HPI Alan Richardson is a 32 y.o. male.  The history is provided by the patient. No language interpreter was used.     32 year old male presenting with flulike symptoms.  He endorsed for the past day he has had fever, chills, body aches, congestion and cough.  Denies nausea vomiting diarrhea.  No specific treatment tried.  He did not have his flu shot this year.  He denies any recent travel.  He does report recent sick contact.  He endorsed feeling thirsty but having been drinking much today.  No complaints of shortness of breath abdominal pain dysuria or rash.  No recent travel.  Past Medical History:  Diagnosis Date  . Reflux     There are no active problems to display for this patient.   No past surgical history on file.      Home Medications    Prior to Admission medications   Medication Sig Start Date End Date Taking? Authorizing Provider  acetaminophen (TYLENOL) 500 MG tablet Take 1,500-2,000 mg by mouth every 6 (six) hours as needed for moderate pain.    [provider]  diclofenac (VOLTAREN) 50 MG EC tablet Take 1 tablet (50 mg total) by mouth 2 (two) times daily. 09/16/13   Antony MaduraHumes, Kelly, PA-C  methocarbamol (ROBAXIN) 500 MG tablet Take 1 tablet (500 mg total) 2 (two) times daily by mouth. 02/23/17   Garlon HatchetSanders, Lisa M, PA-C  naproxen (NAPROSYN) 500 MG tablet Take 1 tablet (500 mg total) 2 (two) times daily with a meal by mouth. 02/23/17   Garlon HatchetSanders, Lisa M, PA-C  Ranitidine HCl (ZANTAC 75 PO) Take by mouth.    [provider]    Family History No family history on file.  Social History Social History   Tobacco Use  . Smoking status: Current Every Day Smoker    Packs/day: 0.25  . Smokeless tobacco: Never Used  Substance Use Topics   . Alcohol use: Yes    Comment: on weekends  . Drug use: No     Allergies   Patient has no known allergies.   Review of Systems Review of Systems  All other systems reviewed and are negative.    Physical Exam Updated Vital Signs BP (!) 146/90   Temp 100 F (37.8 C) (Oral)   Resp 20   SpO2 97%   Physical Exam Vitals signs and nursing note reviewed.  Constitutional:      General: He is not in acute distress.    Appearance: He is well-developed.  HENT:     Head: Atraumatic.     Comments: Ears: Left ear canal is mildly erythematous but TM is normal.  No tenderness to manipulations of left earlobe.  Right TM normal Nose: Normal nares Throat: Uvula midline no tonsillar edema no exudates Eyes:     Conjunctiva/sclera: Conjunctivae normal.  Neck:     Musculoskeletal: Neck supple. No neck rigidity.  Skin:    Findings: No rash.  Neurological:     Mental Status: He is alert.      ED Treatments / Results  Labs (all labs ordered are listed, but only abnormal results are displayed) Labs Reviewed - No data to display  EKG None  Radiology No results found.  Procedures Procedures (including critical  care time)  Medications Ordered in ED Medications - No data to display   Initial Impression / Assessment and Plan / ED Course  I have reviewed the triage vital signs and the nursing notes.  Pertinent labs & imaging results that were available during my care of the patient were reviewed by me and considered in my medical decision making (see chart for details).     BP (!) 146/90   Temp 100 F (37.8 C) (Oral)   Resp 20   SpO2 97%    Final Clinical Impressions(s) / ED Diagnoses   Final diagnoses:  Influenza-like illness    ED Discharge Orders         Ordered    oseltamivir (TAMIFLU) 75 MG capsule  Every 12 hours     04/14/18 2241         Patient with symptoms consistent with influenza.  Vitals are stable, low-grade fever.  No signs of dehydration,  tolerating PO's.  Lungs are clear. Due to patient's presentation and physical exam a chest x-ray was not ordered bc likely diagnosis of flu.  Discussed the cost versus benefit of Tamiflu treatment with the patient.  The patient understands that symptoms are greater than the recommended 24-48 hour window of treatment.  Patient will be discharged with instructions to orally hydrate, rest, and use over-the-counter medications such as anti-inflammatories ibuprofen and Aleve for muscle aches and Tylenol for fever.      Fayrene Helperran, Aramis Zobel, PA-C 04/14/18 2242    Terrilee FilesButler, Michael C, MD 04/15/18 1325

## 2018-04-14 NOTE — ED Triage Notes (Signed)
Pt reports fever, body aches/chills X 1 day. Denies N/V/D. Pt has not tried anything OTC.

## 2018-08-03 ENCOUNTER — Encounter (HOSPITAL_COMMUNITY): Payer: Self-pay

## 2018-08-03 ENCOUNTER — Emergency Department (HOSPITAL_COMMUNITY): Payer: Self-pay

## 2018-08-03 ENCOUNTER — Emergency Department (HOSPITAL_COMMUNITY)
Admission: EM | Admit: 2018-08-03 | Discharge: 2018-08-03 | Disposition: A | Discharge status: Home / Self Care | Payer: Self-pay | Attending: Emergency Medicine | Admitting: Emergency Medicine

## 2018-08-03 ENCOUNTER — Other Ambulatory Visit: Payer: Self-pay

## 2018-08-03 DIAGNOSIS — R10815 Periumbilic abdominal tenderness: Secondary | ICD-10-CM | POA: Insufficient documentation

## 2018-08-03 DIAGNOSIS — L02211 Cutaneous abscess of abdominal wall: Secondary | ICD-10-CM | POA: Insufficient documentation

## 2018-08-03 DIAGNOSIS — R350 Frequency of micturition: Secondary | ICD-10-CM | POA: Insufficient documentation

## 2018-08-03 DIAGNOSIS — Z711 Person with feared health complaint in whom no diagnosis is made: Secondary | ICD-10-CM

## 2018-08-03 DIAGNOSIS — R6883 Chills (without fever): Secondary | ICD-10-CM | POA: Insufficient documentation

## 2018-08-03 DIAGNOSIS — F17210 Nicotine dependence, cigarettes, uncomplicated: Secondary | ICD-10-CM | POA: Insufficient documentation

## 2018-08-03 DIAGNOSIS — Z202 Contact with and (suspected) exposure to infections with a predominantly sexual mode of transmission: Secondary | ICD-10-CM | POA: Insufficient documentation

## 2018-08-03 LAB — CBC WITH DIFFERENTIAL/PLATELET
Abs Immature Granulocytes: 0.04 10*3/uL (ref 0.00–0.07)
Basophils Absolute: 0 10*3/uL (ref 0.0–0.1)
Basophils Relative: 0 %
Eosinophils Absolute: 0.2 10*3/uL (ref 0.0–0.5)
Eosinophils Relative: 2 %
HCT: 43.4 % (ref 39.0–52.0)
Hemoglobin: 14.2 g/dL (ref 13.0–17.0)
Immature Granulocytes: 0 %
Lymphocytes Relative: 24 %
Lymphs Abs: 2.4 10*3/uL (ref 0.7–4.0)
MCH: 29 pg (ref 26.0–34.0)
MCHC: 32.7 g/dL (ref 30.0–36.0)
MCV: 88.6 fL (ref 80.0–100.0)
Monocytes Absolute: 0.9 10*3/uL (ref 0.1–1.0)
Monocytes Relative: 9 %
Neutro Abs: 6.6 10*3/uL (ref 1.7–7.7)
Neutrophils Relative %: 65 %
Platelets: 208 10*3/uL (ref 150–400)
RBC: 4.9 MIL/uL (ref 4.22–5.81)
RDW: 12.9 % (ref 11.5–15.5)
WBC: 10.2 10*3/uL (ref 4.0–10.5)
nRBC: 0 % (ref 0.0–0.2)

## 2018-08-03 LAB — COMPREHENSIVE METABOLIC PANEL
ALT: 9 U/L (ref 0–44)
AST: 22 U/L (ref 15–41)
Albumin: 3.7 g/dL (ref 3.5–5.0)
Alkaline Phosphatase: 59 U/L (ref 38–126)
Anion gap: 8 (ref 5–15)
BUN: 7 mg/dL (ref 6–20)
CO2: 27 mmol/L (ref 22–32)
Calcium: 9.3 mg/dL (ref 8.9–10.3)
Chloride: 104 mmol/L (ref 98–111)
Creatinine, Ser: 1.01 mg/dL (ref 0.61–1.24)
GFR calc Af Amer: >60 mL/min (ref >60)
GFR calc non Af Amer: >60 mL/min (ref >60)
Glucose, Bld: 82 mg/dL (ref 70–99)
Potassium: 4 mmol/L (ref 3.5–5.1)
Sodium: 139 mmol/L (ref 135–145)
Total Bilirubin: 0.6 mg/dL (ref 0.3–1.2)
Total Protein: 6.9 g/dL (ref 6.5–8.1)

## 2018-08-03 LAB — URINALYSIS, ROUTINE W REFLEX MICROSCOPIC
Bilirubin Urine: NEGATIVE
Glucose, UA: NEGATIVE mg/dL
Ketones, ur: NEGATIVE mg/dL
Nitrite: NEGATIVE
Protein, ur: NEGATIVE mg/dL
Specific Gravity, Urine: 1.024 (ref 1.005–1.030)
pH: 5 (ref 5.0–8.0)

## 2018-08-03 LAB — LIPASE, BLOOD: Lipase: 36 U/L (ref 11–51)

## 2018-08-03 LAB — I-STAT CREATININE, ED: Creatinine, Ser: 0.8 mg/dL (ref 0.61–1.24)

## 2018-08-03 MED ORDER — FENTANYL CITRATE (PF) 100 MCG/2ML IJ SOLN
100.0000 ug | Freq: Once | INTRAMUSCULAR | Status: AC
  Administered 2018-08-03: 100 ug via INTRAVENOUS
  Filled 2018-08-03: qty 2

## 2018-08-03 MED ORDER — CEPHALEXIN 500 MG PO CAPS: 500.0000 mg | ORAL_CAPSULE | Freq: Four times a day (QID) | ORAL | 0 refills | Status: AC

## 2018-08-03 MED ORDER — AZITHROMYCIN 250 MG PO TABS
1000.0000 mg | ORAL_TABLET | Freq: Once | ORAL | Status: AC
  Administered 2018-08-03: 1000 mg via ORAL
  Filled 2018-08-03: qty 4

## 2018-08-03 MED ORDER — MORPHINE SULFATE (PF) 4 MG/ML IV SOLN
4.0000 mg | Freq: Once | INTRAVENOUS | Status: AC
  Administered 2018-08-03: 4 mg via INTRAVENOUS
  Filled 2018-08-03: qty 1

## 2018-08-03 MED ORDER — LIDOCAINE-EPINEPHRINE (PF) 2 %-1:200000 IJ SOLN
10.0000 mL | Freq: Once | INTRAMUSCULAR | Status: DC
  Filled 2018-08-03: qty 20

## 2018-08-03 MED ORDER — CEFTRIAXONE SODIUM 250 MG IJ SOLR
250.0000 mg | Freq: Once | INTRAMUSCULAR | Status: AC
  Administered 2018-08-03: 250 mg via INTRAMUSCULAR
  Filled 2018-08-03: qty 250

## 2018-08-03 MED ORDER — IOHEXOL 300 MG/ML  SOLN
100.0000 mL | Freq: Once | INTRAMUSCULAR | Status: AC | PRN
  Administered 2018-08-03: 100 mL via INTRAVENOUS

## 2018-08-03 MED ORDER — LIDOCAINE-EPINEPHRINE-TETRACAINE (LET) SOLUTION
3.0000 mL | Freq: Once | NASAL | Status: AC
  Administered 2018-08-03: 3 mL via TOPICAL
  Filled 2018-08-03: qty 3

## 2018-08-03 MED ORDER — CEPHALEXIN 250 MG PO CAPS
500.0000 mg | ORAL_CAPSULE | Freq: Once | ORAL | Status: AC
  Administered 2018-08-03: 500 mg via ORAL
  Filled 2018-08-03: qty 2

## 2018-08-03 MED ORDER — LIDOCAINE HCL (PF) 1 % IJ SOLN
INTRAMUSCULAR | Status: AC
  Administered 2018-08-03: 1 mL
  Filled 2018-08-03: qty 5

## 2018-08-03 NOTE — ED Provider Notes (Signed)
[name] EMERGENCY DEPARTMENT Provider Note   CSN: [id number] Arrival date & time: 08/03/18  1356    History   Chief Complaint Chief Complaint  Patient presents with  . Abscess  . Abdominal Pain    HPI Alan Richardson is a 33 y.o. male presents today for evaluation of acute onset, progressively worsening left lower quadrant abdominal pain for 1 week.  He reports constant aching throbbing pain that worsens with any movement and palpation of the area.  He reports feeling an area of swelling and induration more superficially.  He went to see his PCP today who sent him to the ED for evaluation of possible abdominal wall abscess.  He notes some urinary frequency, subjective fevers and chills, and a few episodes of soft stools but denies nausea vomiting, chest pain, or shortness of breath.  He does tell me that a sexual partner of his tested positive for gonorrhea. No testicular pain.  Has been taking ibuprofen, Tylenol 3, and Percocet with [place] relief of his symptoms.     The history is provided by the patient.    Past Medical History:  Diagnosis Date  . Reflux     There are no active problems to display for this patient.   History reviewed. No pertinent surgical history.      Home Medications    Prior to Admission medications   Medication Sig Start Date End Date Taking? Authorizing Provider  acetaminophen (TYLENOL) 500 MG tablet Take 1,500-2,000 mg by mouth every 6 (six) hours as needed for moderate pain.    [provider]  cephALEXin (KEFLEX) 500 MG capsule Take 1 capsule (500 mg total) by mouth 4 (four) times daily for 5 days. 08/03/18 08/08/18  Michela Pitcher A, PA-C  diclofenac (VOLTAREN) 50 MG EC tablet Take 1 tablet (50 mg total) by mouth 2 (two) times daily. 09/16/13   Antony Madura, PA-C  methocarbamol (ROBAXIN) 500 MG tablet Take 1 tablet (500 mg total) 2 (two) times daily by mouth. 02/23/17   Garlon Hatchet, PA-C  naproxen (NAPROSYN) 500 MG  tablet Take 1 tablet (500 mg total) 2 (two) times daily with a meal by mouth. 02/23/17   Garlon Hatchet, PA-C  oseltamivir (TAMIFLU) 75 MG capsule Take 1 capsule (75 mg total) by mouth every 12 (twelve) hours. 04/14/18   Fayrene Helper, PA-C  Ranitidine HCl (ZANTAC 75 PO) Take by mouth.    [provider]    Family History History reviewed. No pertinent family history.  Social History Social History   Tobacco Use  . Smoking status: Current Every Day Smoker    Packs/day: 0.25  . Smokeless tobacco: Never Used  Substance Use Topics  . Alcohol use: Yes    Comment: on weekends  . Drug use: No     Allergies   Patient has no known allergies.   Review of Systems Review of Systems  Constitutional: Positive for chills and fever.  Respiratory: Negative for shortness of breath.   Cardiovascular: Negative for chest pain.  Gastrointestinal: Positive for abdominal pain and diarrhea. Negative for nausea and rectal pain.  Genitourinary: Positive for frequency. Negative for dysuria.  All other systems reviewed and are negative.    Physical Exam Updated Vital Signs BP 115/72   Pulse 74   Temp 97.6 F (36.4 C) (Oral)   Resp 16   SpO2 98%   Physical Exam Vitals signs and nursing note reviewed.  Constitutional:      General: He  is not in acute distress.    Appearance: He is well-developed.  HENT:     Head: Normocephalic and atraumatic.  Eyes:     General:        Right eye: No discharge.        Left eye: No discharge.     Conjunctiva/sclera: Conjunctivae normal.  Neck:     Vascular: No JVD.     Trachea: No tracheal deviation.  Cardiovascular:     Rate and Rhythm: Normal rate and regular rhythm.  Pulmonary:     Effort: Pulmonary effort is normal.     Breath sounds: Normal breath sounds.  Abdominal:     General: Bowel sounds are increased. There is no distension.     Palpations: Abdomen is soft.     Tenderness: There is abdominal tenderness in the suprapubic area and  left lower quadrant. There is guarding. There is no right CVA tenderness, left CVA tenderness or rebound.     Comments: Voluntary guarding noted.  Patient has a 7 x 4 cm area of induration to the right lower quadrant of the abdomen near the groin, no significant erythema or warmth noted.  No fluctuance.  Skin:    Findings: No erythema.  Neurological:     Mental Status: He is alert.  Psychiatric:        Behavior: Behavior normal.      ED Treatments / Results  Labs (all labs ordered are listed, but only abnormal results are displayed) Labs Reviewed  URINALYSIS, ROUTINE W REFLEX MICROSCOPIC - Abnormal; Notable for the following components:      Result Value   APPearance HAZY (*)    Hgb urine dipstick SMALL (*)    Leukocytes,Ua SMALL (*)    Bacteria, UA FEW (*)    All other components within normal limits  CBC WITH DIFFERENTIAL/PLATELET  COMPREHENSIVE METABOLIC PANEL  LIPASE, BLOOD  RPR  HIV ANTIBODY (ROUTINE TESTING W REFLEX)  I-STAT CREATININE, ED  GC/CHLAMYDIA PROBE AMP ([place]) NOT AT [facility]    EKG None  Radiology Ct Abdomen Pelvis W Contrast  Result Date: 08/03/2018 CLINICAL DATA:  S fever, abdominal pain EXAM: CT ABDOMEN AND PELVIS WITH CONTRAST TECHNIQUE: Multidetector CT imaging of the abdomen and pelvis was performed using the standard protocol following bolus administration of intravenous contrast. CONTRAST:  OMNIPAQUE IOHEXOL 300 MG/ML  SOLN COMPARISON:  None. FINDINGS: Lower chest: No acute abnormality. Hepatobiliary: No focal liver abnormality is seen. No gallstones, gallbladder wall thickening, or biliary dilatation. Pancreas: Unremarkable. No pancreatic ductal dilatation or surrounding inflammatory changes. Spleen: Normal in size without focal abnormality. Adrenals/Urinary Tract: Adrenal glands are unremarkable. Kidneys are normal, without renal calculi, focal lesion, or hydronephrosis. Bladder is unremarkable. Stomach/Bowel: Stomach is within normal limits.  Appendix appears normal. No evidence of bowel wall thickening, distention, or inflammatory changes. Vascular/Lymphatic: No significant vascular findings are present. No enlarged abdominal or pelvic lymph nodes. Reproductive: Prostate is unremarkable. Other: Hazy inflammatory changes in the left anterior abdominal wall with skin thickening. 4.1 x 2.1 cm complex fluid collection in the left anterior abdominal wall most consistent with an abscess. Musculoskeletal: No acute osseous abnormality. No aggressive osseous lesion. IMPRESSION: 1. 4.1 x 2.1 cm complex fluid collection in the left anterior abdominal wall consistent with an abscess with mild surrounding cellulitis. Electronically Signed   By: Elige Ko   On: 08/03/2018 19:37    Procedures Ultrasound ED Soft Tissue Date/Time: 08/03/2018 9:17 PM Performed by: Jeanie Sewer, PA-C Authorized by:  Jeanie SewerFawze, Abdo Denault A, PA-C   Procedure details:    Indications: localization of abscess and evaluate for cellulitis     Transverse view:  Visualized   Longitudinal view:  Visualized   Images: archived     Limitations:  Patient compliance Location:    Location: abdominal wall     Side:  Left Findings:     abscess present    cellulitis present .Marland Kitchen.Incision and Drainage Date/Time: 08/03/2018 9:17 PM Performed by: Jeanie SewerFawze, Patina Spanier A, PA-C Authorized by: Jeanie SewerFawze, Keidra Withers A, PA-C   Consent:    Consent obtained:  Verbal   Consent given by:  Patient   Risks discussed:  Bleeding, incomplete drainage, pain, damage to other organs and infection   Alternatives discussed:  No treatment Universal protocol:    Procedure explained and questions answered to patient or proxy's satisfaction: yes     Relevant documents present and verified: yes     Test results available and properly labeled: yes     Imaging studies available: yes     Required blood products, implants, devices, and special equipment available: yes     Site/side marked: yes     Immediately prior to procedure a  time out was called: yes     Patient identity confirmed:  Verbally with patient Location:    Type:  Abscess   Size:  4.1x2.1cm   Location:  Trunk   Trunk location:  Abdomen Pre-procedure details:    Skin preparation:  Betadine Anesthesia (see MAR for exact dosages):    Anesthesia method:  Local infiltration and topical application   Topical anesthetic:  LET   Local anesthetic:  Lidocaine 2% WITH epi Procedure type:    Complexity:  Complex Procedure details:    Incision types:  Single straight   Incision depth:  Subcutaneous   Scalpel blade:  11   Wound management:  Probed and deloculated, irrigated with saline and extensive cleaning   Drainage:  Purulent and bloody   Drainage amount:  Copious   Wound treatment:  Wound left open   Packing materials:  1/4 in gauze Post-procedure details:    Patient tolerance of procedure:  Tolerated well, no immediate complications   (including critical care time)  Medications Ordered in ED Medications  lidocaine-EPINEPHrine (XYLOCAINE W/EPI) 2 %-1:200000 (PF) injection 10 mL (0 mLs Intradermal Hold 08/03/18 2014)  cephALEXin (KEFLEX) capsule 500 mg (has no administration in time range)  morphine 4 MG/ML injection 4 mg (4 mg Intravenous Given 08/03/18 1720)  iohexol (OMNIPAQUE) 300 MG/ML solution 100 mL (100 mLs Intravenous Contrast Given 08/03/18 1836)  lidocaine-EPINEPHrine-tetracaine (LET) solution (3 mLs Topical Given 08/03/18 2013)  fentaNYL (SUBLIMAZE) injection 100 mcg (100 mcg Intravenous Given 08/03/18 2036)  cefTRIAXone (ROCEPHIN) injection 250 mg (250 mg Intramuscular Given 08/03/18 2011)  azithromycin (ZITHROMAX) tablet 1,000 mg (1,000 mg Oral Given 08/03/18 2011)  lidocaine (PF) (XYLOCAINE) 1 % injection (1 mL  Given 08/03/18 2012)     Initial Impression / Assessment and Plan / ED Course  I have reviewed the triage vital signs and the nursing notes.  Pertinent labs & imaging results that were available during my care of the patient  were reviewed by me and considered in my medical decision making (see chart for details).         Patient with complaint of left lower quadrant abdominal wall tenderness sent from primary care for rule out of abdominal wall abscess.  He is afebrile, vital signs are stable.  He is nontoxic in appearance.  No peritoneal signs on examination of the abdomen.  He is a focal area of induration and significant tenderness to the left lower quadrant of the abdomen.  Will obtain lab work and imaging for further assessment.  Lab work reviewed by me shows no leukocytosis, no anemia, no metabolic derangements.  He is UA is concerning for infection and he does tell me that he had a sexual partner recently tell him that she tested positive for gonorrhea.  Will treat for GC chlamydia, further cultures obtained and he knows they are pending.  He knows to inform all sexual partners that he tested positive and that they should present to the health department for testing and treatment.  He has no complaint of testicular pain, scrotal swelling, penile pain, and I have a low suspicion of epididymitis, orchitis, or prostatitis.  No pain with bowel movements.  CT of the abdomen and pelvis shows a 4.1 x 2 point centimeter complex fluid collection in the left anterior abdominal wall consistent with abscess.  No other findings noted.  No evidence of acute surgical intra-abdominal pathology.  He does not appear to be septic at this time. Abscess amenable to incision and drainage.  He was consented for the procedure which he tolerated without difficulty.  Appropriate analgesia was provided.  Wound was packed with iodoform gauze. Encouraged home warm soaks and flushing.  Will discharge with Keflex due to signs of surrounding cellulitis on imaging.  Recommend follow-up in 2 days for wound recheck and packing removal.  Discussed indications for return to the ED sooner. Pt verbalized understanding of and agreement with plan and is safe  for discharge home at this time.  Patient seen and evaluated by Dr. Hyacinth Meeker who agrees with assessment and plan at this time.   Final Clinical Impressions(s) / ED Diagnoses   Final diagnoses:  Abdominal wall abscess  Concern about STD in male without diagnosis    ED Discharge Orders         Ordered    cephALEXin (KEFLEX) 500 MG capsule  4 times daily     08/03/18 2109           Bennye Alm 08/03/18 2121    Eber Hong, MD 08/06/18 [phone]

## 2018-08-03 NOTE — ED Triage Notes (Signed)
Pt was sent here by his PCP for possible abdominal wall abscess. Pt reports pain X1 week.

## 2018-08-03 NOTE — Discharge Instructions (Signed)
1. Medications: Please take all of your antibiotics until finished!   You may develop abdominal discomfort or diarrhea from the antibiotic.  You may help offset this with probiotics which you can buy or get in yogurt. Do not eat  or take the probiotics until 2 hours after your antibiotic.  Alternate 600 mg of ibuprofen and [phone] mg of Tylenol every 3 hours as needed for pain. Do not exceed 4000 mg of Tylenol daily.  Take ibuprofen with food to avoid upset stomach issues. 2. Treatment: ice for swelling, keep wound clean with warm soap and water and keep bandage dry, do not submerge in water for 24 hours 3. Follow Up: Please return in 2 days to have your packing removed or sooner if you have concerns.  You may also go to urgent care or your primary care physician.  Return to the emergency department sooner if any concerning signs or symptoms develop such as high fevers, spread of redness, severe pain, persistent vomiting.   WOUND CARE  Keep area clean and dry for 24 hours. Do not remove bandage, if applied.  After 24 hours, remove bandage and wash wound gently with mild soap and warm water. Reapply a new bandage after cleaning wound, if directed.   Continue daily cleansing with soap and water until stitches/staples are removed.  Do not apply any ointments or creams to the wound as this may cause delayed healing. Return if you experience any of the following signs of infection: Swelling, redness, pus drainage, streaking, fever >101.0 F  Return if you experience excessive bleeding that does not stop after 15-20 minutes of constant, firm pressure.

## 2018-08-03 NOTE — ED Provider Notes (Signed)
Medical screening examination/treatment/procedure(s) were conducted as a shared visit with non-physician practitioner(s) and myself.  I personally evaluated the patient during the encounter.  Clinical Impression:   Final diagnoses:  Abdominal wall abscess  Concern about STD in male without diagnosis    The patient is a 33 year old male presenting with a complaint of left lower abdominal wall pain.  There is an area of discrete induration and on CT scan there does appear to be an abscess.  This will need to be drained.  The patient was informed that this would be painful but that we would use local anesthesia and some intravenous pain medication, he will also need treatment for gonorrhea which she states he has and that explains his urinalysis.  Patient otherwise appears stable and is not septic.   Eber Hong, MD 08/06/18 [phone]

## 2018-08-04 LAB — HIV ANTIBODY (ROUTINE TESTING W REFLEX): HIV Screen 4th Generation wRfx: NONREACTIVE

## 2018-08-04 LAB — RPR: RPR Ser Ql: NONREACTIVE

## 2018-08-06 ENCOUNTER — Telehealth: Payer: Self-pay | Admitting: *Deleted

## 2018-08-06 NOTE — Telephone Encounter (Signed)
Received call from pt's and gave permission to speak to girlfriend, Toni Amend. She had question about packing from wound. States instructions were to come back to ED to have removed or pt could remove. Pt's girlfriend plans to remove. Reviewed with girlfriend signs and symptoms of infection. And to follow up with PCP, Pt goes to [facility]. Girlfriend will call to arrange appt. Isidoro Donning RN CCM Case Mgmt phone [phone]

## 2019-02-05 ENCOUNTER — Encounter (HOSPITAL_COMMUNITY): Payer: Self-pay

## 2019-02-05 ENCOUNTER — Ambulatory Visit (HOSPITAL_COMMUNITY)
Admission: EM | Admit: 2019-02-05 | Discharge: 2019-02-05 | Disposition: A | Discharge status: Home / Self Care | Payer: Self-pay | Attending: Family Medicine | Admitting: Family Medicine

## 2019-02-05 ENCOUNTER — Other Ambulatory Visit: Payer: Self-pay

## 2019-02-05 DIAGNOSIS — Z202 Contact with and (suspected) exposure to infections with a predominantly sexual mode of transmission: Secondary | ICD-10-CM

## 2019-02-05 DIAGNOSIS — R369 Urethral discharge, unspecified: Secondary | ICD-10-CM

## 2019-02-05 MED ORDER — METRONIDAZOLE 500 MG PO TABS: 500.0000 mg | ORAL_TABLET | Freq: Two times a day (BID) | ORAL | 0 refills | Status: DC

## 2019-02-05 MED ORDER — CEFTRIAXONE SODIUM 250 MG IJ SOLR
250.0000 mg | Freq: Once | INTRAMUSCULAR | Status: AC
  Administered 2019-02-05: 13:00:00 250 mg via INTRAMUSCULAR

## 2019-02-05 MED ORDER — CEFTRIAXONE SODIUM 250 MG IJ SOLR
INTRAMUSCULAR | Status: AC
  Filled 2019-02-05: qty 250

## 2019-02-05 MED ORDER — AZITHROMYCIN 250 MG PO TABS
ORAL_TABLET | ORAL | Status: AC
  Filled 2019-02-05: qty 4

## 2019-02-05 MED ORDER — AZITHROMYCIN 250 MG PO TABS
1000.0000 mg | ORAL_TABLET | Freq: Once | ORAL | Status: AC
  Administered 2019-02-05: 13:00:00 1000 mg via ORAL

## 2019-02-05 NOTE — ED Provider Notes (Signed)
Alan Richardson   [id number] 02/[birth year] Arrival Time: 1607  ASSESSMENT & PLAN:  1. STD exposure   2. Penile discharge       Discharge Instructions     You have been given the following medications today for treatment of suspected gonorrhea and/or chlamydia:  cefTRIAXone (ROCEPHIN) injection 250 mg azithromycin (ZITHROMAX) tablet 1,000 mg  Even though we have treated you today, we have sent testing for sexually transmitted infections. We will notify you of any positive results once they are received. If required, we will prescribe any medications you might need.  Please refrain from all sexual activity for at least the next seven days.     Pending: Labs Reviewed  CYTOLOGY, (ORAL, ANAL, URETHRAL) ANCILLARY ONLY    Will notify of any positive results. Instructed to refrain from sexual activity for at least seven days. Work note provided.  Reviewed expectations re: course of current medical issues. Questions answered. Outlined signs and symptoms indicating need for more acute intervention. Patient verbalized understanding. After Visit Summary given.   SUBJECTIVE:  Alan Richardson is a 33 y.o. male who presents with complaint of penile discharge and occasional "burning feeling" just after urination. Onset gradual. First noticed on/off over the past few weeks. Describes discharge as thick and white/yellow. Denies: urinary frequency, fever, chills and sweats. Afebrile. No abdominal or pelvic pain. No n/v. No rashes or lesions. Reports that he is sexually active with single male partner who recently tested positive for gonorrhea and trich. OTC treatment: none. History of STI: "a long time ago; not sure what'.  ROS: As per HPI. All other systems negative.   OBJECTIVE:  Vitals:   02/05/19 1201  BP: 127/85  Pulse: 86  Resp: 16  Temp: 97.7 F (36.5 C)  TempSrc: Tympanic  SpO2: 98%     General appearance: alert, cooperative, appears stated age and no distress  Throat: lips, mucosa, and tongue normal; teeth and gums normal CV: RRR Lungs: CTAB Back: no CVA tenderness; FROM at waist Abdomen: soft, non-tender GU: normal appearing genitalia Skin: warm and dry Psychological: alert and cooperative; normal mood and affect.    Labs Reviewed  CYTOLOGY, (ORAL, ANAL, URETHRAL) ANCILLARY ONLY    No Known Allergies  Past Medical History:  Diagnosis Date  . Reflux     Social History   Socioeconomic History  . Marital status: Single    Spouse name: Not on file  . Number of children: Not on file  . Years of education: Not on file  . Highest education level: Not on file  Occupational History  . Not on file  Social Needs  . Financial resource strain: Not on file  . Food insecurity    Worry: Not on file    Inability: Not on file  . Transportation needs    Medical: Not on file    Non-medical: Not on file  Tobacco Use  . Smoking status: Current Every Day Smoker    Packs/day: 0.25  . Smokeless tobacco: Never Used  Substance and Sexual Activity  . Alcohol use: Yes    Comment: on weekends  . Drug use: No  . Sexual activity: Not on file  Lifestyle  . Physical activity    Days per week: Not on file    Minutes per session: Not on file  . Stress: Not on file  Relationships  . Social Herbalist on phone: Not on file    Gets together: Not on file  Attends religious service: Not on file    Active member of club or organization: Not on file    Attends meetings of clubs or organizations: Not on file    Relationship status: Not on file  . Intimate partner violence    Fear of current or ex partner: Not on file    Emotionally abused: Not on file    Physically abused: Not on file    Forced sexual activity: Not on file  Other Topics Concern  . Not on file  Social History Narrative  . Not on file          Alan Layman, MD 02/05/19 1222

## 2019-02-05 NOTE — ED Triage Notes (Signed)
Pt presents with  Penile burning and pain X 1 month; pt states partner got tested and treated for gonorrhea and trichmonisis

## 2019-02-05 NOTE — Discharge Instructions (Signed)

## 2019-02-06 LAB — CYTOLOGY, (ORAL, ANAL, URETHRAL) ANCILLARY ONLY
Chlamydia: NEGATIVE
Neisseria Gonorrhea: NEGATIVE
Trichomonas: POSITIVE — AB

## 2019-02-08 ENCOUNTER — Telehealth (HOSPITAL_COMMUNITY): Payer: Self-pay | Admitting: Emergency Medicine

## 2019-02-08 NOTE — Telephone Encounter (Signed)
Trichomonas is positive. Rx metronidazole was given at the urgent care visit. Pt needs education to please refrain from sexual intercourse for 7 days to give the medicine time to work. Sexual partners need to be notified and tested/treated. Condoms may reduce risk of reinfection. Recheck for further evaluation if symptoms are not improving.   Patient contacted and made aware of    results, all questions answered   

## 2019-09-28 IMAGING — CT CT ABDOMEN AND PELVIS WITH CONTRAST
2 of 4 series · 17 of 46 positions shown, 19 images · IV contrast (APPLIED)
Comparison: None.

CLINICAL DATA: S fever, abdominal pain

EXAM:
CT ABDOMEN AND PELVIS WITH CONTRAST
TECHNIQUE: Multidetector CT imaging of the abdomen and pelvis was performed
using the standard protocol following bolus administration of
intravenous contrast.
CONTRAST:  100mL OMNIPAQUE IOHEXOL 300 MG/ML  SOLN

[Series 3: abd/ pelvis 5.0 i30f 2 · axial · 0.93mm/px · z∈[+733,+1183]mm · 14 of 100 slices shown, 16 images]
[im 5/100  soft-tissue]
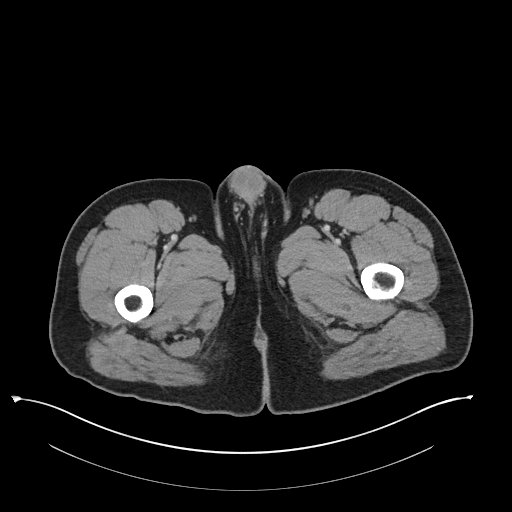
[im 5/100  bone]
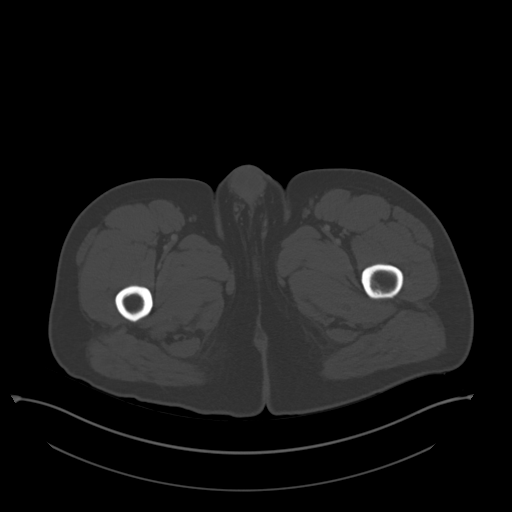
[im 13/100  soft-tissue]
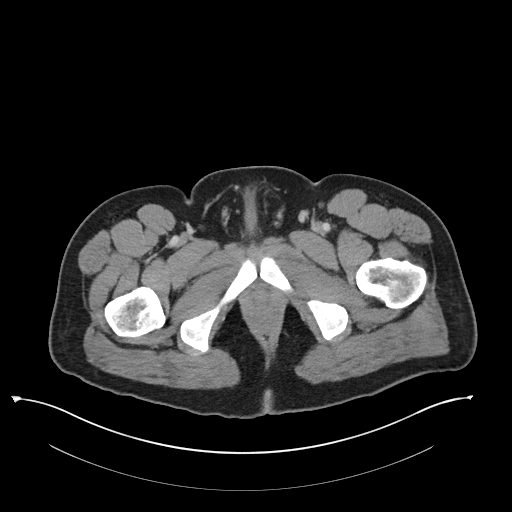
[im 21/100  soft-tissue]
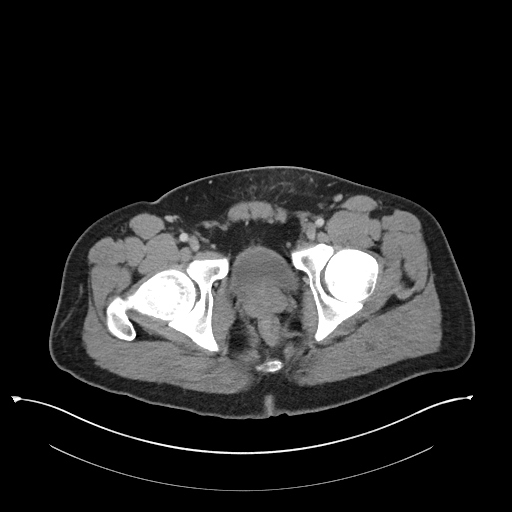
[im 25/100  soft-tissue]
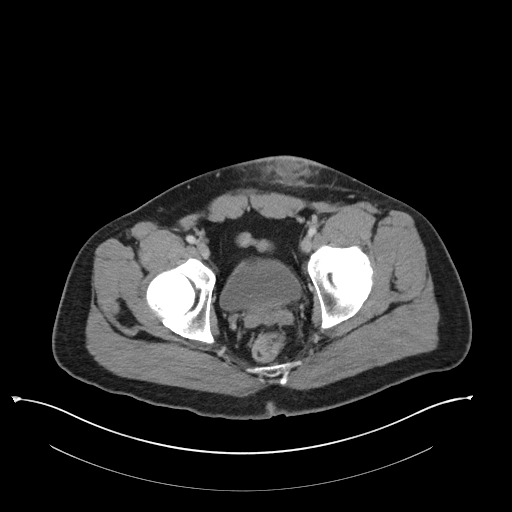
[im 34/100  soft-tissue]
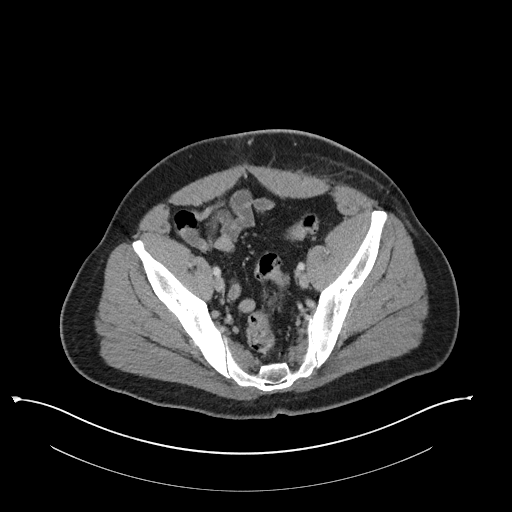
[im 42/100  soft-tissue]
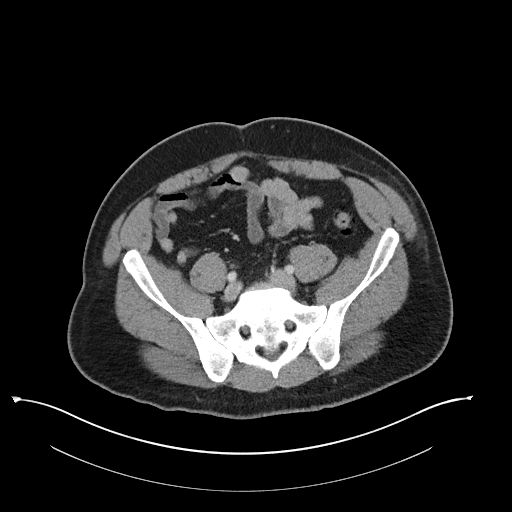
[im 46/100  soft-tissue]
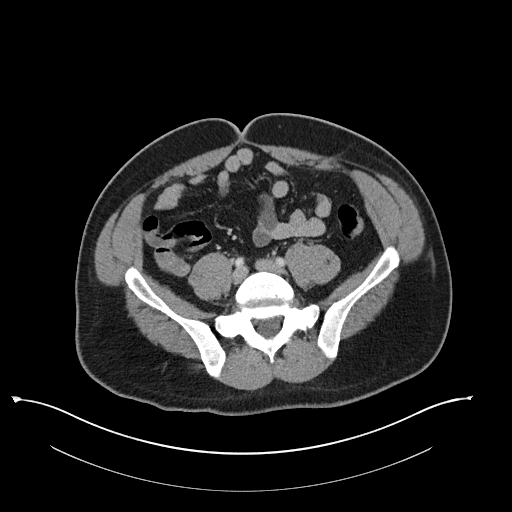
[im 54/100  soft-tissue]
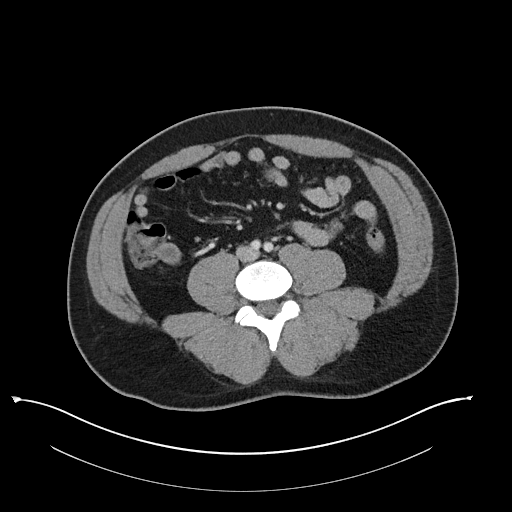
[im 58/100  soft-tissue]
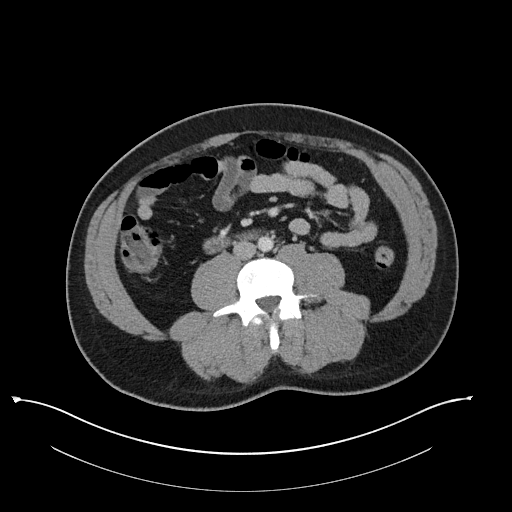
[im 58/100  bone]
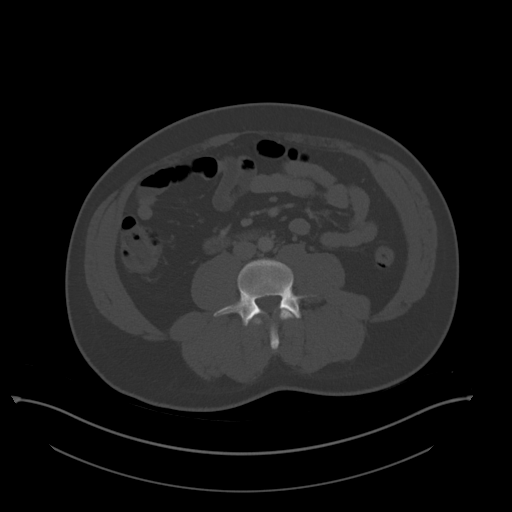
[im 67/100  soft-tissue]
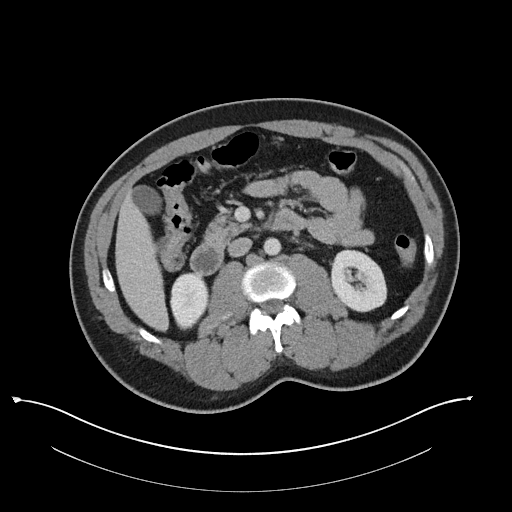
[im 75/100  soft-tissue]
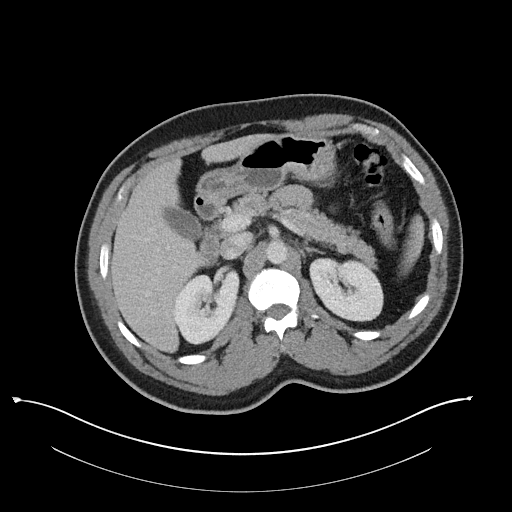
[im 79/100  soft-tissue]
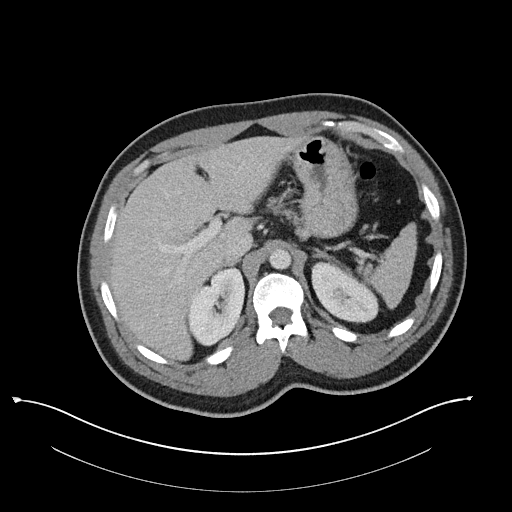
[im 87/100  soft-tissue]
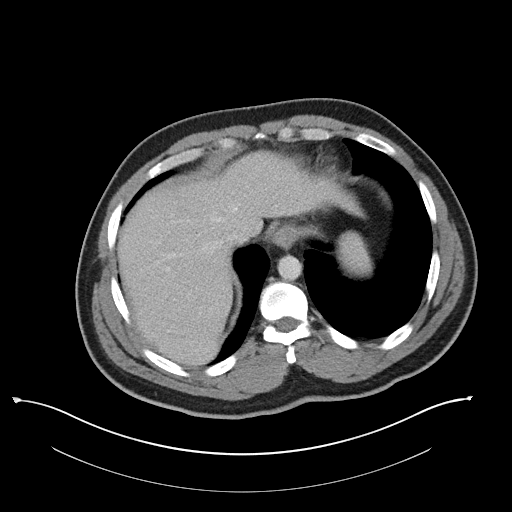
[im 95/100  soft-tissue]
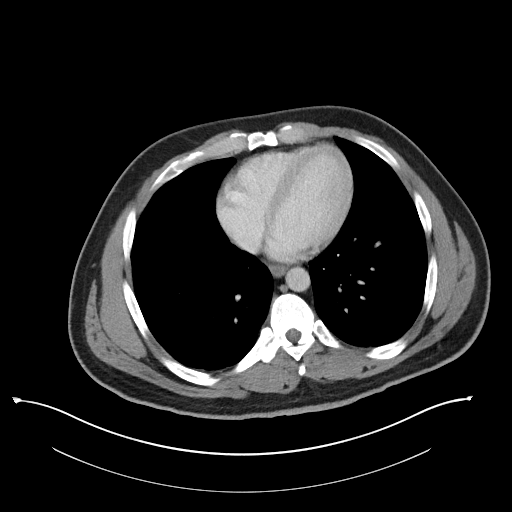

[Series 6: coronal soft tissue · coronal · 0.98mm/px · 3 of 116 slices shown]
[im 39/116  soft-tissue]
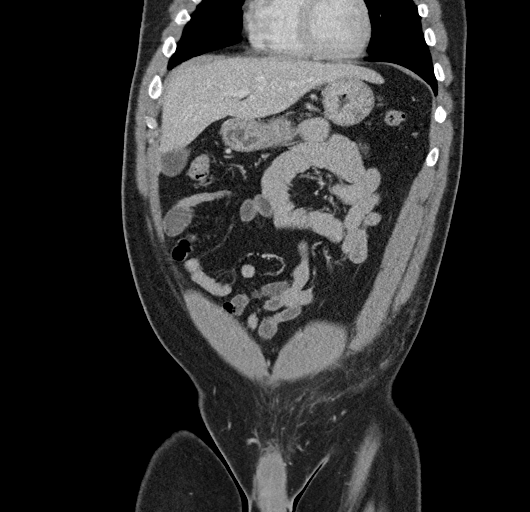
[im 52/116  soft-tissue]
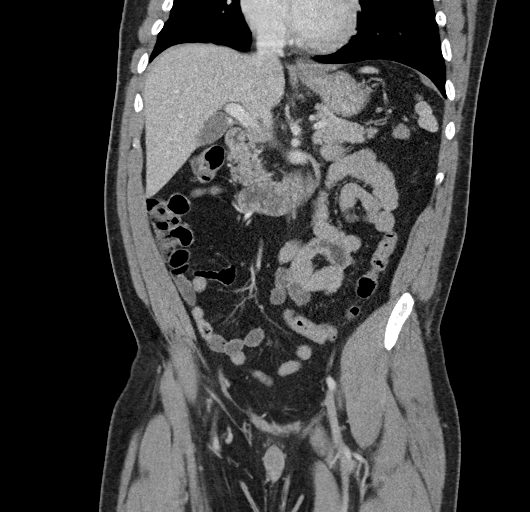
[im 64/116  soft-tissue]
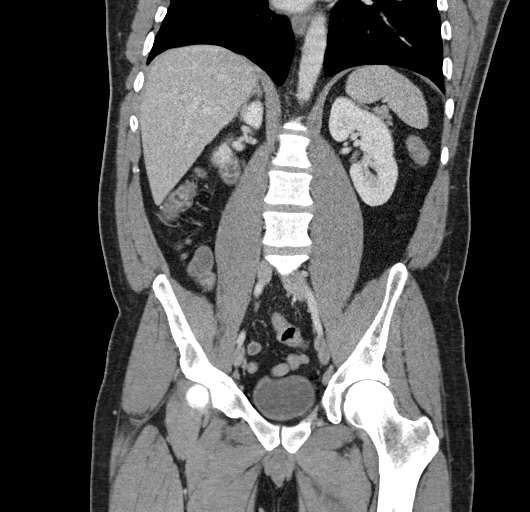

[17 of 46 positions shown; findings below may reference images not displayed]

FINDINGS: Lower chest: No acute abnormality.

Hepatobiliary: No focal liver abnormality is seen. No gallstones,
gallbladder wall thickening, or biliary dilatation.

Pancreas: Unremarkable. No pancreatic ductal dilatation or
surrounding inflammatory changes.

Spleen: Normal in size without focal abnormality.

Adrenals/Urinary Tract: Adrenal glands are unremarkable. Kidneys are
normal, without renal calculi, focal lesion, or hydronephrosis.
Bladder is unremarkable.

Stomach/Bowel: Stomach is within normal limits. Appendix appears
normal. No evidence of bowel wall thickening, distention, or
inflammatory changes.

Vascular/Lymphatic: No significant vascular findings are present. No
enlarged abdominal or pelvic lymph nodes.

Reproductive: Prostate is unremarkable.

Other: Hazy inflammatory changes in the left anterior abdominal wall
with skin thickening. 4.1 x 2.1 cm complex fluid collection in the
left anterior abdominal wall most consistent with an abscess.

Musculoskeletal: No acute osseous abnormality. No aggressive osseous
lesion.
IMPRESSION: 1. 4.1 x 2.1 cm complex fluid collection in the left anterior
abdominal wall consistent with an abscess with mild surrounding
cellulitis.

## 2021-05-20 ENCOUNTER — Encounter (HOSPITAL_BASED_OUTPATIENT_CLINIC_OR_DEPARTMENT_OTHER): Payer: Self-pay | Admitting: *Deleted

## 2021-05-20 ENCOUNTER — Emergency Department (HOSPITAL_BASED_OUTPATIENT_CLINIC_OR_DEPARTMENT_OTHER)
Admission: EM | Admit: 2021-05-20 | Discharge: 2021-05-20 | Disposition: A | Discharge status: Home / Self Care | Payer: Self-pay | Attending: Emergency Medicine | Admitting: Emergency Medicine

## 2021-05-20 ENCOUNTER — Other Ambulatory Visit: Payer: Self-pay

## 2021-05-20 DIAGNOSIS — U071 COVID-19: Secondary | ICD-10-CM | POA: Insufficient documentation

## 2021-05-20 LAB — RESP PANEL BY RT-PCR (FLU A&B, COVID) ARPGX2
Influenza A by PCR: NEGATIVE
Influenza B by PCR: NEGATIVE
SARS Coronavirus 2 by RT PCR: POSITIVE — AB

## 2021-05-20 LAB — CBG MONITORING, ED: Glucose-Capillary: 103 mg/dL — ABNORMAL HIGH (ref 70–99)

## 2021-05-20 MED ORDER — OMEPRAZOLE 20 MG PO CPDR: 20.0000 mg | DELAYED_RELEASE_CAPSULE | Freq: Every day | ORAL | 0 refills | Status: AC

## 2021-05-20 MED ORDER — IBUPROFEN 800 MG PO TABS
800.0000 mg | ORAL_TABLET | Freq: Once | ORAL | Status: AC
  Administered 2021-05-20: 800 mg via ORAL
  Filled 2021-05-20: qty 1

## 2021-05-20 NOTE — ED Triage Notes (Signed)
Fever, chills, headache since yesterday. No fever reducer today.

## 2021-05-20 NOTE — ED Provider Notes (Signed)
[facility] EMERGENCY DEPARTMENT Provider Note   CSN: [id number] Arrival date & time: 05/20/21  1138     History  Chief Complaint  Patient presents with   Fever    Alan Richardson is a 36 y.o. male.  36 year old otherwise healthy male presents with complaint of chills, headache and tactile fever onset yesterday.  Denies cough, congestion, abdominal pain, nausea, vomiting, change in bowel or bladder habits.  No known sick contacts however states he was at a club this past weekend.  States that he is very thirsty, denies frequent urination.  No other complaints or concerns.      Home Medications Prior to Admission medications   Medication Sig Start Date End Date Taking? Authorizing Provider  omeprazole (PRILOSEC) 20 MG capsule Take 1 capsule (20 mg total) by mouth daily for 10 days. 05/20/21 05/30/21 Yes Jeannie Fend, PA-C  acetaminophen (TYLENOL) 500 MG tablet Take 1,500-2,000 mg by mouth every 6 (six) hours as needed for moderate pain.    [provider]  diclofenac (VOLTAREN) 50 MG EC tablet Take 1 tablet (50 mg total) by mouth 2 (two) times daily. 09/16/13   Antony Madura, PA-C  methocarbamol (ROBAXIN) 500 MG tablet Take 1 tablet (500 mg total) 2 (two) times daily by mouth. 02/23/17   Garlon Hatchet, PA-C  metroNIDAZOLE (FLAGYL) 500 MG tablet Take 1 tablet (500 mg total) by mouth 2 (two) times daily. 02/05/19   Mardella Layman, MD  naproxen (NAPROSYN) 500 MG tablet Take 1 tablet (500 mg total) 2 (two) times daily with a meal by mouth. 02/23/17   Garlon Hatchet, PA-C  Ranitidine HCl (ZANTAC 75 PO) Take by mouth.    [provider]      Allergies    Patient has no known allergies.    Review of Systems   Review of Systems Negative except as per HPI Physical Exam Updated Vital Signs BP (!) 145/92 (BP Location: Left Arm)    Pulse 84    Temp 99.4 F (37.4 C) (Oral)    Resp 20    Ht 5\' 9"  (1.753 m)    Wt 99.8 kg    SpO2 100%    BMI 32.49 kg/m  Physical  Exam Vitals and nursing note reviewed.  Constitutional:      General: He is not in acute distress.    Appearance: He is well-developed. He is not diaphoretic.  HENT:     Head: Normocephalic and atraumatic.     Nose: Nose normal.     Mouth/Throat:     Mouth: Mucous membranes are moist.  Eyes:     Conjunctiva/sclera: Conjunctivae normal.  Cardiovascular:     Rate and Rhythm: Normal rate and regular rhythm.     Heart sounds: Normal heart sounds.  Pulmonary:     Effort: Pulmonary effort is normal.     Breath sounds: Normal breath sounds.  Abdominal:     Palpations: Abdomen is soft.     Tenderness: There is no abdominal tenderness.  Musculoskeletal:     Cervical back: Neck supple.     Right lower leg: No edema.     Left lower leg: No edema.  Lymphadenopathy:     Cervical: No cervical adenopathy.  Skin:    General: Skin is warm and dry.  Neurological:     Mental Status: He is alert and oriented to person, place, and time.  Psychiatric:        Behavior: Behavior normal.  ED Results / Procedures / Treatments   Labs (all labs ordered are listed, but only abnormal results are displayed) Labs Reviewed  RESP PANEL BY RT-PCR (FLU A&B, COVID) ARPGX2 - Abnormal; Notable for the following components:      Result Value   SARS Coronavirus 2 by RT PCR POSITIVE (*)    All other components within normal limits  CBG MONITORING, ED - Abnormal; Notable for the following components:   Glucose-Capillary 103 (*)    All other components within normal limits    EKG None  Radiology No results found.  Procedures Procedures    Medications Ordered in ED Medications  ibuprofen (ADVIL) tablet 800 mg (800 mg Oral Given 05/20/21 1337)    ED Course/ Medical Decision Making/ A&P Clinical Course as of 05/20/21 1403  Thu May 20, 2021  5070 36 year old otherwise healthy male presents with complaint of headache, fevers with chills and feeling poorly today.  States he did go to a event this  weekend and may have been exposed to people who are sick.  Exam is unremarkable.  He does report that he is very thirsty and his blood sugar was assessed to check for hyperglycemia, it is reassuring at 103.  Patient was given Motrin for his headache.  He is found to be positive for COVID, negative for flu.  Suspect COVID is giving him his symptoms today recommend home to quarantine, return as needed, Motrin and Tylenol for body aches, fevers, headache. [LM]    Clinical Course User Index [LM] Jeannie Fend, PA-C                           Medical Decision Making         Final Clinical Impression(s) / ED Diagnoses Final diagnoses:  COVID    Rx / DC Orders ED Discharge Orders          Ordered    omeprazole (PRILOSEC) 20 MG capsule  Daily        05/20/21 1336              Alden Hipp 05/20/21 1403    Pollyann Savoy, MD 05/22/21 1610

## 2021-05-20 NOTE — Discharge Instructions (Signed)
Home to rest and hydrate.  Motrin and Tylenol as needed as directed for headache, body aches, fever.

## 2023-02-22 ENCOUNTER — Ambulatory Visit (HOSPITAL_COMMUNITY)
Admission: EM | Admit: 2023-02-22 | Discharge: 2023-02-22 | Disposition: A | Discharge status: Home / Self Care | Payer: Self-pay | Attending: Physician Assistant | Admitting: Physician Assistant

## 2023-02-22 ENCOUNTER — Encounter (HOSPITAL_COMMUNITY): Payer: Self-pay | Admitting: Emergency Medicine

## 2023-02-22 DIAGNOSIS — B009 Herpesviral infection, unspecified: Secondary | ICD-10-CM | POA: Insufficient documentation

## 2023-02-22 DIAGNOSIS — Z202 Contact with and (suspected) exposure to infections with a predominantly sexual mode of transmission: Secondary | ICD-10-CM | POA: Insufficient documentation

## 2023-02-22 DIAGNOSIS — Z113 Encounter for screening for infections with a predominantly sexual mode of transmission: Secondary | ICD-10-CM | POA: Insufficient documentation

## 2023-02-22 MED ORDER — VALACYCLOVIR HCL 500 MG PO TABS: 500.0000 mg | ORAL_TABLET | Freq: Two times a day (BID) | ORAL | 1 refills | Status: DC

## 2023-02-22 MED ORDER — METRONIDAZOLE 500 MG PO TABS: 2000.0000 mg | ORAL_TABLET | Freq: Once | ORAL | 0 refills | Status: AC

## 2023-02-22 NOTE — ED Triage Notes (Signed)
Pt presents to be tested for STDs after partner tested positive for Trichomoniasis.    Pt states he also needs medication for herpes. He has been dx for over 5 years but recently ran out of medication

## 2023-02-22 NOTE — Discharge Instructions (Addendum)
Take metronidazole 2000 mg (4 tablets at once).  Abstain from sex for 1 week after you take your medicine.  Do not drink any alcohol while on this medication and for 3 days after you finish the medicine will cause you to vomit.  All partners need willing to be tested and treated as well.  We will contact you if we need to arrange any additional treatment based on your swab result.  I have called in a refill of Valtrex.  If you develop any symptoms please start this medication twice daily for 3 days.  If you have recurrent episodes please follow-up with your primary care or return to [place] to consider dosing daily to prevent outbreaks.

## 2023-02-22 NOTE — ED Provider Notes (Signed)
[facility]    CSN: [id number] Arrival date & time: 02/22/23  1254      History   Chief Complaint Chief Complaint  Patient presents with   SEXUALLY TRANSMITTED DISEASE    HPI Alan Richardson is a 37 y.o. male.   Patient presents today requesting treatment for trichomonas.  Reports that one of his sexual partners contacted him and said that they tested positive.  He denies any significant symptoms including penile discharge, dysuria, genital lesion, abdominal pain, fever, nausea, vomiting.  Reports he did have some mild irritation a few weeks ago but this is since resolved.  He has tested positive for trichomonas in the past with last treatment several years ago.  He denies any recent antibiotics.  He has no concern for additional STI and declined testing for HIV, hepatitis, syphilis.  He does report being diagnosed with herpes over 5 years ago.  He reports getting outbreaks a few times per year and is requesting a refill of Valtrex to have on hand in case he develops symptoms again.  He has not taken suppressive therapy in the past.  He denies any current symptoms but reports it is very difficult for him to get to the doctor and so would like to have this medication available if he develops symptoms.  He sexually active with male partners but does not always use condoms.  No additional complaints or concerns today.    Past Medical History:  Diagnosis Date   Reflux     There are no problems to display for this patient.   History reviewed. No pertinent surgical history.     Home Medications    Prior to Admission medications   Medication Sig Start Date End Date Taking? Authorizing Provider  valACYclovir (VALTREX) 500 MG tablet Take 1 tablet (500 mg total) by mouth 2 (two) times daily. 02/22/23  Yes Janneth Krasner, Noberto Retort, PA-C  acetaminophen (TYLENOL) 500 MG tablet Take 1,500-2,000 mg by mouth every 6 (six) hours as needed for moderate pain.    [provider]   diclofenac (VOLTAREN) 50 MG EC tablet Take 1 tablet (50 mg total) by mouth 2 (two) times daily. 09/16/13   Antony Madura, PA-C  methocarbamol (ROBAXIN) 500 MG tablet Take 1 tablet (500 mg total) 2 (two) times daily by mouth. 02/23/17   Garlon Hatchet, PA-C  metroNIDAZOLE (FLAGYL) 500 MG tablet Take 4 tablets (2,000 mg total) by mouth once for 1 dose. 02/22/23 02/22/23  Joshus Rogan, Noberto Retort, PA-C  naproxen (NAPROSYN) 500 MG tablet Take 1 tablet (500 mg total) 2 (two) times daily with a meal by mouth. 02/23/17   Garlon Hatchet, PA-C  omeprazole (PRILOSEC) 20 MG capsule Take 1 capsule (20 mg total) by mouth daily for 10 days. 05/20/21 05/30/21  Army Melia A, PA-C  Ranitidine HCl (ZANTAC 75 PO) Take by mouth.    [provider]    Family History Family History  Family history unknown: Yes    Social History Social History   Tobacco Use   Smoking status: Every Day    Current packs/day: 0.25    Types: Cigarettes   Smokeless tobacco: Never  Vaping Use   Vaping status: Never Used  Substance Use Topics   Alcohol use: Yes    Comment: on weekends   Drug use: No     Allergies   Patient has no known allergies.   Review of Systems Review of Systems  Constitutional:  Negative for activity change, appetite change,  fatigue and fever.  Gastrointestinal:  Negative for abdominal pain, diarrhea, nausea and vomiting.  Genitourinary:  Negative for dysuria, frequency, genital sores, penile discharge, penile pain and urgency.     Physical Exam Triage Vital Signs ED Triage Vitals  Encounter Vitals Group     BP 02/22/23 1329 (!) 149/93     Systolic BP Percentile --      Diastolic BP Percentile --      Pulse Rate 02/22/23 1329 74     Resp 02/22/23 1329 16     Temp 02/22/23 1329 98 F (36.7 C)     Temp Source 02/22/23 1329 Oral     SpO2 02/22/23 1329 98 %     Weight --      Height --      Head Circumference --      Peak Flow --      Pain Score 02/22/23 1332 0     Pain Loc --      Pain  Education --      Exclude from Growth Chart --    No data found.  Updated Vital Signs BP (!) 149/93 (BP Location: Left Arm)   Pulse 74   Temp 98 F (36.7 C) (Oral)   Resp 16   SpO2 98%   Visual Acuity Right Eye Distance:   Left Eye Distance:   Bilateral Distance:    Right Eye Near:   Left Eye Near:    Bilateral Near:     Physical Exam Vitals reviewed.  Constitutional:      General: He is awake.     Appearance: Normal appearance. He is well-developed. He is not ill-appearing.     Comments: Very pleasant male appears stated age in no acute distress sitting comfortably in exam room  HENT:     Head: Normocephalic and atraumatic.     Mouth/Throat:     Pharynx: No oropharyngeal exudate, posterior oropharyngeal erythema or uvula swelling.  Cardiovascular:     Rate and Rhythm: Normal rate and regular rhythm.     Heart sounds: Normal heart sounds, S1 normal and S2 normal. No murmur heard. Pulmonary:     Effort: Pulmonary effort is normal.     Breath sounds: Normal breath sounds. No stridor. No wheezing, rhonchi or rales.     Comments: Clear to auscultation bilaterally Abdominal:     General: Bowel sounds are normal.     Palpations: Abdomen is soft.     Tenderness: There is no abdominal tenderness. There is no right CVA tenderness, left CVA tenderness, guarding or rebound.  Genitourinary:    Comments: Exam deferred Neurological:     Mental Status: He is alert.  Psychiatric:        Behavior: Behavior is cooperative.      UC Treatments / Results  Labs (all labs ordered are listed, but only abnormal results are displayed) Labs Reviewed  CYTOLOGY, (ORAL, ANAL, URETHRAL) ANCILLARY ONLY    EKG   Radiology No results found.  Procedures Procedures (including critical care time)  Medications Ordered in UC Medications - No data to display  Initial Impression / Assessment and Plan / UC Course  I have reviewed the triage vital signs and the nursing  notes.  Pertinent labs & imaging results that were available during my care of the patient were reviewed by me and considered in my medical decision making (see chart for details).     Patient is well-appearing, afebrile, nontoxic, nontachycardic.  Given known exposure to trichomonas will  cover with metronidazole 2 g one-time.  Discussed that he is not to drink any alcohol for 3 days after this dose as it can cause him to vomit due to associated Antabuse side effects.  He is to abstain from sex for a week after treatment to prevent spreading this disease.  All partners went to be tested and treated as well.  Offered additional STI testing including HIV, hepatitis, syphilis but he declined this.  He was open to an STI swab and we will contact him if he is positive for gonorrhea/chlamydia and requires additional testing.  Discussed that if he develops any symptoms he should return for reevaluation.  He is currently asymptomatic but requested a refill of Valtrex to have on hand should he develop any recurrent outbreaks.  Prescription with 1 refill was sent to pharmacy.  Discussed that if he has recurrent episodes it may be worthwhile to return or see his primary care to consider suppressive therapy.  Discussed the importance of barrier protection to prevent spreading this disease.  Discussed that if anything worsens or changes he is to be seen immediately.  Final Clinical Impressions(s) / UC Diagnoses   Final diagnoses:  Exposure to trichomonas  Screening examination for STI  HSV infection     Discharge Instructions      Take metronidazole 2000 mg (4 tablets at once).  Abstain from sex for 1 week after you take your medicine.  Do not drink any alcohol while on this medication and for 3 days after you finish the medicine will cause you to vomit.  All partners need willing to be tested and treated as well.  We will contact you if we need to arrange any additional treatment based on your swab  result.  I have called in a refill of Valtrex.  If you develop any symptoms please start this medication twice daily for 3 days.  If you have recurrent episodes please follow-up with your primary care or return to [place] to consider dosing daily to prevent outbreaks.     ED Prescriptions     Medication Sig Dispense Auth. Provider   metroNIDAZOLE (FLAGYL) 500 MG tablet Take 4 tablets (2,000 mg total) by mouth once for 1 dose. 4 tablet Yurianna Tusing K, PA-C   valACYclovir (VALTREX) 500 MG tablet Take 1 tablet (500 mg total) by mouth 2 (two) times daily. 6 tablet Leor Whyte, Noberto Retort, PA-C      PDMP not reviewed this encounter.   Jeani Hawking, PA-C 02/22/23 1413

## 2023-02-23 LAB — CYTOLOGY, (ORAL, ANAL, URETHRAL) ANCILLARY ONLY
Chlamydia: NEGATIVE
Comment: NEGATIVE
Comment: NEGATIVE
Comment: NORMAL
Neisseria Gonorrhea: NEGATIVE

## 2024-01-09 ENCOUNTER — Encounter (HOSPITAL_COMMUNITY): Payer: Self-pay | Admitting: *Deleted

## 2024-01-09 ENCOUNTER — Other Ambulatory Visit: Payer: Self-pay

## 2024-01-09 ENCOUNTER — Ambulatory Visit (HOSPITAL_COMMUNITY): Payer: Self-pay

## 2024-01-09 ENCOUNTER — Ambulatory Visit (HOSPITAL_COMMUNITY)
Admission: EM | Admit: 2024-01-09 | Discharge: 2024-01-09 | Disposition: A | Discharge status: Home / Self Care | Payer: Self-pay | Attending: Student | Admitting: Student

## 2024-01-09 DIAGNOSIS — K219 Gastro-esophageal reflux disease without esophagitis: Secondary | ICD-10-CM

## 2024-01-09 DIAGNOSIS — A6000 Herpesviral infection of urogenital system, unspecified: Secondary | ICD-10-CM

## 2024-01-09 MED ORDER — FAMOTIDINE 10 MG PO TABS: 10.0000 mg | ORAL_TABLET | Freq: Two times a day (BID) | ORAL | 3 refills | Status: AC

## 2024-01-09 MED ORDER — VALACYCLOVIR HCL 500 MG PO TABS: 500.0000 mg | ORAL_TABLET | Freq: Two times a day (BID) | ORAL | 3 refills | Status: AC

## 2024-01-09 NOTE — ED Provider Notes (Signed)
 [facility]    CSN: [id number] Arrival date & time: 01/09/24  1127      History   Chief Complaint Chief Complaint  Patient presents with   Herpes Zoster   Heartburn    HPI Alan Richardson is a 38 y.o. male presenting with recurrence of GERD and genital herpes following running out of his medication.  He describes epigastric burning, no symptoms at present.  Describes lesions in the genital area, consistent with past herpes outbreaks.  HPI  Past Medical History:  Diagnosis Date   Reflux     There are no active problems to display for this patient.   History reviewed. No pertinent surgical history.     Home Medications    Prior to Admission medications   Medication Sig Start Date End Date Taking? Authorizing Provider  famotidine  (ZANTAC 360) 10 MG tablet Take 1 tablet (10 mg total) by mouth 2 (two) times daily. 01/09/24 02/08/24 Yes Arlyss Leita BRAVO, PA-C  omeprazole  (PRILOSEC) 20 MG capsule Take 1 capsule (20 mg total) by mouth daily for 10 days. 05/20/21 05/30/21  Beverley Leita A, PA-C  Ranitidine HCl (ZANTAC 75 PO) Take by mouth.    [provider]  valACYclovir  (VALTREX ) 500 MG tablet Take 1 tablet (500 mg total) by mouth 2 (two) times daily. 01/09/24   Arlyss Leita BRAVO, PA-C    Family History Family History  Family history unknown: Yes    Social History Social History   Tobacco Use   Smoking status: Every Day    Current packs/day: 0.25    Types: Cigarettes   Smokeless tobacco: Never  Vaping Use   Vaping status: Never Used  Substance Use Topics   Alcohol use: Yes    Comment: on weekends   Drug use: No     Allergies   Patient has no known allergies.   Review of Systems Review of Systems  Skin:  Positive for rash.     Physical Exam Triage Vital Signs ED Triage Vitals  Encounter Vitals Group     BP 01/09/24 1148 130/85     Girls Systolic BP Percentile --      Girls Diastolic BP Percentile --      Boys Systolic BP  Percentile --      Boys Diastolic BP Percentile --      Pulse Rate 01/09/24 1148 72     Resp 01/09/24 1148 18     Temp 01/09/24 1148 97.9 F (36.6 C)     Temp src --      SpO2 01/09/24 1148 97 %     Weight --      Height --      Head Circumference --      Peak Flow --      Pain Score 01/09/24 1146 0     Pain Loc --      Pain Education --      Exclude from Growth Chart --    No data found.  Updated Vital Signs BP 130/85   Pulse 72   Temp 97.9 F (36.6 C)   Resp 18   SpO2 97%   Visual Acuity Right Eye Distance:   Left Eye Distance:   Bilateral Distance:    Right Eye Near:   Left Eye Near:    Bilateral Near:     Physical Exam Vitals reviewed.  Constitutional:      General: He is not in acute distress.    Appearance: Normal appearance. He  is not ill-appearing.  HENT:     Head: Normocephalic and atraumatic.  Cardiovascular:     Rate and Rhythm: Normal rate and regular rhythm.  Pulmonary:     Effort: Pulmonary effort is normal.  Neurological:     General: No focal deficit present.     Mental Status: He is alert and oriented to person, place, and time.  Psychiatric:        Mood and Affect: Mood normal.        Behavior: Behavior normal.        Thought Content: Thought content normal.        Judgment: Judgment normal.      UC Treatments / Results  Labs (all labs ordered are listed, but only abnormal results are displayed) Labs Reviewed - No data to display  EKG   Radiology No results found.  Procedures Procedures (including critical care time)  Medications Ordered in UC Medications - No data to display  Initial Impression / Assessment and Plan / UC Course  I have reviewed the triage vital signs and the nursing notes.  Pertinent labs & imaging results that were available during my care of the patient were reviewed by me and considered in my medical decision making (see chart for details).      GERD Sent Famotidine .   Herpes simplex  infection Refilled Valtrex .  Final Clinical Impressions(s) / UC Diagnoses   Final diagnoses:  Gastroesophageal reflux disease without esophagitis  Herpes simplex infection of genitourinary system   Discharge Instructions   None    ED Prescriptions     Medication Sig Dispense Auth. Provider   valACYclovir  (VALTREX ) 500 MG tablet Take 1 tablet (500 mg total) by mouth 2 (two) times daily. 6 tablet Shanasia Ibrahim E, PA-C   famotidine  (ZANTAC 360) 10 MG tablet Take 1 tablet (10 mg total) by mouth 2 (two) times daily. 60 tablet Alexandro Line E, PA-C      PDMP not reviewed this encounter.   Arlyss Leita BRAVO, PA-C 01/09/24 1221

## 2024-01-09 NOTE — ED Triage Notes (Signed)
 PT Denies any CP . 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  PT reports lesions on penis that started on Sundayand has been DX with herpes in past PT also reports heart burn for one year ,PT needs refill on meds for heart burn .
